# Patient Record
Sex: Male | Born: 1955 | Race: White | Hispanic: No | Marital: Married | Smoking: Never smoker
Health system: Southern US, Community
[De-identification: ages and names within clinical notes are randomized; demographics above are authoritative.]

## PROBLEM LIST (undated history)

## (undated) DIAGNOSIS — K219 Gastro-esophageal reflux disease without esophagitis: Secondary | ICD-10-CM

## (undated) DIAGNOSIS — S92919A Unspecified fracture of unspecified toe(s), initial encounter for closed fracture: Secondary | ICD-10-CM

## (undated) DIAGNOSIS — K5730 Diverticulosis of large intestine without perforation or abscess without bleeding: Secondary | ICD-10-CM

## (undated) DIAGNOSIS — K921 Melena: Secondary | ICD-10-CM

## (undated) DIAGNOSIS — I10 Essential (primary) hypertension: Secondary | ICD-10-CM

## (undated) DIAGNOSIS — Z9889 Other specified postprocedural states: Secondary | ICD-10-CM

## (undated) DIAGNOSIS — H269 Unspecified cataract: Secondary | ICD-10-CM

## (undated) DIAGNOSIS — IMO0002 Reserved for concepts with insufficient information to code with codable children: Secondary | ICD-10-CM

## (undated) DIAGNOSIS — D649 Anemia, unspecified: Secondary | ICD-10-CM

## (undated) DIAGNOSIS — K5090 Crohn's disease, unspecified, without complications: Secondary | ICD-10-CM

## (undated) DIAGNOSIS — Z8719 Personal history of other diseases of the digestive system: Secondary | ICD-10-CM

## (undated) HISTORY — DX: Unspecified cataract: H26.9

## (undated) HISTORY — PX: APPENDECTOMY: SHX54

## (undated) HISTORY — DX: Personal history of other diseases of the digestive system: Z98.890

## (undated) HISTORY — DX: Diverticulosis of large intestine without perforation or abscess without bleeding: K57.30

## (undated) HISTORY — PX: KNEE CARTILAGE SURGERY: SHX688

## (undated) HISTORY — DX: Unspecified fracture of unspecified toe(s), initial encounter for closed fracture: S92.919A

## (undated) HISTORY — DX: Anemia, unspecified: D64.9

## (undated) HISTORY — DX: Essential (primary) hypertension: I10

## (undated) HISTORY — PX: ARTHROSCOPIC REPAIR ACL: SUR80

## (undated) HISTORY — PX: CHOLECYSTECTOMY: SHX55

## (undated) HISTORY — PX: HEMICOLECTOMY: SHX854

## (undated) HISTORY — DX: Gastro-esophageal reflux disease without esophagitis: K21.9

## (undated) HISTORY — DX: Crohn's disease, unspecified, without complications: K50.90

## (undated) HISTORY — PX: OTHER SURGICAL HISTORY: SHX169

## (undated) HISTORY — PX: HERNIA REPAIR: SHX51

## (undated) HISTORY — DX: Personal history of other diseases of the digestive system: Z87.19

## (undated) HISTORY — DX: Melena: K92.1

## (undated) HISTORY — PX: LUNG SURGERY: SHX703

---

## 2006-05-12 ENCOUNTER — Ambulatory Visit: Payer: Self-pay | Admitting: Internal Medicine

## 2006-06-14 ENCOUNTER — Ambulatory Visit: Payer: Self-pay | Admitting: Internal Medicine

## 2006-08-17 ENCOUNTER — Ambulatory Visit: Payer: Self-pay | Admitting: Internal Medicine

## 2006-09-06 ENCOUNTER — Ambulatory Visit (HOSPITAL_COMMUNITY): Admission: RE | Admit: 2006-09-06 | Discharge: 2006-09-06 | Payer: Self-pay | Admitting: Internal Medicine

## 2006-09-06 HISTORY — PX: ESOPHAGOGASTRODUODENOSCOPY: SHX1529

## 2006-10-28 ENCOUNTER — Ambulatory Visit: Payer: Self-pay | Admitting: Internal Medicine

## 2006-12-28 ENCOUNTER — Ambulatory Visit: Payer: Self-pay | Admitting: Internal Medicine

## 2007-03-31 ENCOUNTER — Ambulatory Visit: Payer: Self-pay | Admitting: Internal Medicine

## 2007-04-17 ENCOUNTER — Inpatient Hospital Stay (HOSPITAL_COMMUNITY): Admission: EM | Admit: 2007-04-17 | Discharge: 2007-04-18 | Payer: Self-pay | Admitting: Emergency Medicine

## 2007-04-18 ENCOUNTER — Ambulatory Visit: Payer: Self-pay | Admitting: Gastroenterology

## 2007-04-22 ENCOUNTER — Ambulatory Visit: Payer: Self-pay | Admitting: Internal Medicine

## 2007-05-24 ENCOUNTER — Ambulatory Visit: Payer: Self-pay | Admitting: Internal Medicine

## 2007-11-10 ENCOUNTER — Ambulatory Visit: Payer: Self-pay | Admitting: Internal Medicine

## 2008-03-14 ENCOUNTER — Emergency Department (HOSPITAL_COMMUNITY): Admission: EM | Admit: 2008-03-14 | Discharge: 2008-03-14 | Payer: Self-pay | Admitting: Psychology

## 2008-08-02 ENCOUNTER — Telehealth (INDEPENDENT_AMBULATORY_CARE_PROVIDER_SITE_OTHER): Payer: Self-pay

## 2008-09-07 ENCOUNTER — Ambulatory Visit: Payer: Self-pay | Admitting: Internal Medicine

## 2008-09-07 DIAGNOSIS — K5090 Crohn's disease, unspecified, without complications: Secondary | ICD-10-CM | POA: Insufficient documentation

## 2008-09-07 DIAGNOSIS — K921 Melena: Secondary | ICD-10-CM | POA: Insufficient documentation

## 2008-09-24 ENCOUNTER — Ambulatory Visit: Payer: Self-pay | Admitting: Internal Medicine

## 2008-09-24 ENCOUNTER — Encounter: Payer: Self-pay | Admitting: Internal Medicine

## 2008-09-24 ENCOUNTER — Ambulatory Visit (HOSPITAL_COMMUNITY): Admission: RE | Admit: 2008-09-24 | Discharge: 2008-09-24 | Payer: Self-pay | Admitting: Internal Medicine

## 2008-09-24 HISTORY — PX: OTHER SURGICAL HISTORY: SHX169

## 2008-09-25 ENCOUNTER — Encounter: Payer: Self-pay | Admitting: Internal Medicine

## 2008-11-08 ENCOUNTER — Telehealth (INDEPENDENT_AMBULATORY_CARE_PROVIDER_SITE_OTHER): Payer: Self-pay

## 2008-12-30 HISTORY — PX: OTHER SURGICAL HISTORY: SHX169

## 2009-01-13 ENCOUNTER — Inpatient Hospital Stay (HOSPITAL_COMMUNITY): Admission: EM | Admit: 2009-01-13 | Discharge: 2009-01-15 | Payer: Self-pay | Admitting: Emergency Medicine

## 2009-01-14 ENCOUNTER — Ambulatory Visit: Payer: Self-pay | Admitting: Internal Medicine

## 2009-01-16 ENCOUNTER — Ambulatory Visit (HOSPITAL_COMMUNITY): Admission: RE | Admit: 2009-01-16 | Discharge: 2009-01-16 | Payer: Self-pay | Admitting: Internal Medicine

## 2009-01-16 ENCOUNTER — Ambulatory Visit: Payer: Self-pay | Admitting: Internal Medicine

## 2009-01-16 ENCOUNTER — Encounter: Payer: Self-pay | Admitting: Internal Medicine

## 2009-01-16 DIAGNOSIS — D649 Anemia, unspecified: Secondary | ICD-10-CM | POA: Insufficient documentation

## 2009-01-16 HISTORY — PX: ESOPHAGOGASTRODUODENOSCOPY: SHX1529

## 2009-01-22 ENCOUNTER — Encounter: Payer: Self-pay | Admitting: Internal Medicine

## 2009-01-25 ENCOUNTER — Encounter: Payer: Self-pay | Admitting: Internal Medicine

## 2009-02-26 ENCOUNTER — Encounter (INDEPENDENT_AMBULATORY_CARE_PROVIDER_SITE_OTHER): Payer: Self-pay

## 2009-04-04 ENCOUNTER — Encounter (INDEPENDENT_AMBULATORY_CARE_PROVIDER_SITE_OTHER): Payer: Self-pay | Admitting: *Deleted

## 2009-04-17 ENCOUNTER — Encounter: Payer: Self-pay | Admitting: Internal Medicine

## 2009-05-01 HISTORY — PX: OTHER SURGICAL HISTORY: SHX169

## 2009-05-08 ENCOUNTER — Ambulatory Visit: Payer: Self-pay | Admitting: Internal Medicine

## 2009-05-09 ENCOUNTER — Ambulatory Visit (HOSPITAL_COMMUNITY): Admission: RE | Admit: 2009-05-09 | Discharge: 2009-05-09 | Payer: Self-pay | Admitting: Internal Medicine

## 2009-05-10 ENCOUNTER — Ambulatory Visit (HOSPITAL_COMMUNITY): Admission: RE | Admit: 2009-05-10 | Discharge: 2009-05-10 | Payer: Self-pay | Admitting: Internal Medicine

## 2009-05-10 ENCOUNTER — Encounter: Payer: Self-pay | Admitting: Internal Medicine

## 2009-05-15 ENCOUNTER — Encounter: Payer: Self-pay | Admitting: Internal Medicine

## 2009-05-15 ENCOUNTER — Ambulatory Visit (HOSPITAL_COMMUNITY): Admission: RE | Admit: 2009-05-15 | Discharge: 2009-05-15 | Payer: Self-pay | Admitting: Internal Medicine

## 2009-05-17 ENCOUNTER — Ambulatory Visit: Payer: Self-pay | Admitting: Internal Medicine

## 2009-05-20 ENCOUNTER — Telehealth (INDEPENDENT_AMBULATORY_CARE_PROVIDER_SITE_OTHER): Payer: Self-pay

## 2009-05-28 ENCOUNTER — Encounter: Payer: Self-pay | Admitting: Urgent Care

## 2009-05-28 ENCOUNTER — Ambulatory Visit (HOSPITAL_COMMUNITY): Admission: RE | Admit: 2009-05-28 | Discharge: 2009-05-28 | Payer: Self-pay | Admitting: Internal Medicine

## 2009-05-28 ENCOUNTER — Telehealth (INDEPENDENT_AMBULATORY_CARE_PROVIDER_SITE_OTHER): Payer: Self-pay | Admitting: *Deleted

## 2009-05-30 ENCOUNTER — Encounter: Payer: Self-pay | Admitting: Internal Medicine

## 2009-06-13 ENCOUNTER — Encounter: Payer: Self-pay | Admitting: Internal Medicine

## 2009-09-19 ENCOUNTER — Encounter: Payer: Self-pay | Admitting: Gastroenterology

## 2009-10-23 ENCOUNTER — Encounter: Payer: Self-pay | Admitting: Internal Medicine

## 2009-10-29 ENCOUNTER — Ambulatory Visit: Payer: Self-pay | Admitting: Internal Medicine

## 2009-10-30 ENCOUNTER — Encounter: Payer: Self-pay | Admitting: Internal Medicine

## 2009-12-17 ENCOUNTER — Ambulatory Visit: Payer: Self-pay | Admitting: Cardiology

## 2009-12-17 ENCOUNTER — Observation Stay (HOSPITAL_COMMUNITY): Admission: EM | Admit: 2009-12-17 | Discharge: 2009-12-18 | Payer: Self-pay | Admitting: Emergency Medicine

## 2009-12-18 ENCOUNTER — Encounter (INDEPENDENT_AMBULATORY_CARE_PROVIDER_SITE_OTHER): Payer: Self-pay | Admitting: Internal Medicine

## 2010-01-23 ENCOUNTER — Encounter: Payer: Self-pay | Admitting: Internal Medicine

## 2010-03-06 ENCOUNTER — Telehealth (INDEPENDENT_AMBULATORY_CARE_PROVIDER_SITE_OTHER): Payer: Self-pay | Admitting: *Deleted

## 2010-04-18 ENCOUNTER — Encounter (INDEPENDENT_AMBULATORY_CARE_PROVIDER_SITE_OTHER): Payer: Self-pay

## 2010-05-12 ENCOUNTER — Encounter (INDEPENDENT_AMBULATORY_CARE_PROVIDER_SITE_OTHER): Payer: Self-pay | Admitting: *Deleted

## 2010-06-04 ENCOUNTER — Emergency Department (HOSPITAL_COMMUNITY)
Admission: EM | Admit: 2010-06-04 | Discharge: 2010-06-04 | Payer: Self-pay | Source: Home / Self Care | Admitting: Emergency Medicine

## 2010-07-01 NOTE — Medication Information (Signed)
Summary: RX Folder  RX Folder   Imported By: Hendricks Limes LPN 09/[birth year]/1914 78:29:56  _____________________________________________________________________  External Attachment:    Type:   Image     Comment:   External Document

## 2010-07-01 NOTE — Miscellaneous (Signed)
Summary: Orders Update  Clinical Lists Changes  Orders: Added new Test order of T-Basic Metabolic Panel ([id number]) - Signed Added new Test order of T-CBC w/Diff ([id number]) - Signed 

## 2010-07-01 NOTE — Letter (Signed)
Summary: Recall, Labs Needed  [facility]  [address]   [place], [place] [address]   Phone: [phone]  Fax: [phone]    April 18, 2010  ANDREJ SPAGNOLI [address] [place], [place]  [address] 1955/11/08   Dear Mr. Bollier,   Our records indicate it is time to repeat your blood work.  You can take the enclosed form to the lab on or near the date indicated.  Please make note of the new location of the lab:   [address], 2nd floor   [facility]  Our office will call you within a week to ten business days with the results.  If you do not hear from [place] in 10 business days, you should call the office.  If you have any questions regarding this, call the office at [phone], and ask for the nurse.  Labs are due on 05/01/2010.   Sincerely,    Hendricks Limes LPN  [facility] Ph: [phone]   Fax: [phone]

## 2010-07-01 NOTE — Letter (Signed)
Summary: GI CLINIC APPT CONFIRMATION  GI CLINIC APPT CONFIRMATION   Imported By: Diana Eves 06/13/2009 09:39:19  _____________________________________________________________________  External Attachment:    Type:   Image     Comment:   External Document

## 2010-07-01 NOTE — Letter (Signed)
Summary: External Other  External Other   Imported By: Peggyann Shoals 10/23/2009 09:05:24  _____________________________________________________________________  External Attachment:    Type:   Image     Comment:   External Document

## 2010-07-01 NOTE — Progress Notes (Signed)
Summary: Change Rx to Generic ASAP  Phone Note Call from Patient   Reason for Call: Refill Medication, Talk to Nurse Summary of Call: Pt's wife [facility]) called and wants [place] to change Pt's Rx of Entocort EC 3 mg XR24h-cap to GENERIC (Budesonide) because it'll be cheaper on her insurance and he will also need a 90 day supply. She said to fax new RX to [phone]. Initial call taken by: Diana Eves,  March 06, 2010 3:34 PM     Appended Document: Change Rx to Generic ASAP    Prescriptions: BUDESONIDE 3 MG XR24H-CAP (BUDESONIDE) Two (6mg ) by mouth daily  #180 x 1   Entered and Authorized by:   Leanna Battles. Dixon Boos   Signed by:   Leanna Battles Lewis PA-C on 03/07/2010   Method used:   Printed then faxed to ...         RxID:   [id number]     Appended Document: Change Rx to Generic ASAP rx faxed to number above

## 2010-07-01 NOTE — Letter (Signed)
Summary: External Other  External Other   Imported By: Peggyann Shoals 10/30/2009 11:45:19  _____________________________________________________________________  External Attachment:    Type:   Image     Comment:   External Document
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
## 2010-07-01 NOTE — Letter (Signed)
Summary: [facility]S [facility]  [facility]S [facility]   Imported By: Rexene Alberts 01/23/2010 09:05:56  _____________________________________________________________________  External Attachment:    Type:   Image     Comment:   External Document

## 2010-07-03 NOTE — Letter (Signed)
Summary: Recall Office Visit  [facility]  [address]   [place], [place] [address]   Phone: [phone]  Fax: [phone]      May 12, 2010   [name] [address] [place], [place]  [address] 31-Jul-1955   Dear [name],   According to our records, it is time for you to schedule a follow-up office visit with [place].   At your convenience, please call [phone] to schedule an office visit. If you have any questions, concerns, or feel that this letter is in error, we would appreciate your call.   Sincerely,    Diana Eves  [facility] Ph: [phone]   Fax: [phone]

## 2010-08-12 ENCOUNTER — Encounter: Payer: Self-pay | Admitting: Urgent Care

## 2010-08-16 LAB — CBC: Hemoglobin: 12.8 g/dL — ABNORMAL LOW (ref 13.0–17.0)

## 2010-08-16 LAB — DIFFERENTIAL
Basophils Absolute: 0 10*3/uL (ref 0.0–0.1)
Basophils Relative: 0 % (ref 0–1)
Eosinophils Absolute: 0.4 10*3/uL (ref 0.0–0.7)
Eosinophils Relative: 3 % (ref 0–5)
Lymphs Abs: 2.1 10*3/uL (ref 0.7–4.0)
Neutro Abs: 8.1 10*3/uL — ABNORMAL HIGH (ref 1.7–7.7)

## 2010-08-16 LAB — COMPREHENSIVE METABOLIC PANEL
Alkaline Phosphatase: 50 U/L (ref 39–117)
BUN: 6 mg/dL (ref 6–23)
CO2: 24 mEq/L (ref 19–32)
Calcium: 8.9 mg/dL (ref 8.4–10.5)
Chloride: 95 mEq/L — ABNORMAL LOW (ref 96–112)
Creatinine, Ser: 0.63 mg/dL (ref 0.4–1.5)
GFR calc Af Amer: >60 mL/min (ref >60)
GFR calc non Af Amer: >60 mL/min (ref >60)
Potassium: 3.4 mEq/L — ABNORMAL LOW (ref 3.5–5.1)
Sodium: 128 mEq/L — ABNORMAL LOW (ref 135–145)
Total Protein: 6.3 g/dL (ref 6.0–8.3)

## 2010-08-16 LAB — CARDIAC PANEL(CRET KIN+CKTOT+MB+TROPI)
Relative Index: INVALID (ref 0.0–2.5)
Total CK: 76 U/L (ref 7–232)
Troponin I: 0.03 ng/mL (ref 0.00–0.06)

## 2010-08-16 LAB — POCT CARDIAC MARKERS
Myoglobin, poc: 31 ng/mL (ref 12–200)
Troponin i, poc: <0.05 ng/mL (ref 0.00–0.09)

## 2010-08-16 LAB — GLUCOSE, CAPILLARY: Glucose-Capillary: 123 mg/dL — ABNORMAL HIGH (ref 70–99)

## 2010-08-16 LAB — TSH: TSH: 2.129 u[IU]/mL (ref 0.350–4.500)

## 2010-08-19 NOTE — Letter (Signed)
Summary: [facility](CHOLESTYRAMINE PACKET)  [facility] [facility](CHOLESTYRAMINE PACKET)   Imported By: Rexene Alberts 08/12/2010 11:06:36  _____________________________________________________________________  External Attachment:    Type:   Image     Comment:   External Document

## 2010-08-22 ENCOUNTER — Other Ambulatory Visit: Payer: Self-pay | Admitting: Urgent Care

## 2010-08-22 ENCOUNTER — Ambulatory Visit: Payer: Self-pay | Admitting: Internal Medicine

## 2010-08-22 MED ORDER — ESOMEPRAZOLE MAGNESIUM 40 MG PO CPDR: 40.0000 mg | DELAYED_RELEASE_CAPSULE | Freq: Two times a day (BID) | ORAL | Status: DC

## 2010-08-22 MED ORDER — BUDESONIDE 3 MG PO CP24: 6.0000 mg | ORAL_CAPSULE | ORAL | Status: DC

## 2010-08-22 MED ORDER — CYANOCOBALAMIN 1000 MCG/ML IJ SOLN: 1000.0000 ug | INTRAMUSCULAR | Status: DC

## 2010-08-25 ENCOUNTER — Ambulatory Visit: Payer: Self-pay | Admitting: Internal Medicine

## 2010-09-06 LAB — BASIC METABOLIC PANEL
BUN: 9 mg/dL (ref 6–23)
CO2: 24 mEq/L (ref 19–32)
CO2: 25 mEq/L (ref 19–32)
CO2: 27 mEq/L (ref 19–32)
Calcium: 9 mg/dL (ref 8.4–10.5)
Calcium: 9.6 mg/dL (ref 8.4–10.5)
Chloride: 93 mEq/L — ABNORMAL LOW (ref 96–112)
Chloride: 95 mEq/L — ABNORMAL LOW (ref 96–112)
Chloride: 85 mEq/L — ABNORMAL LOW (ref 96–112)
Creatinine, Ser: 0.66 mg/dL (ref 0.4–1.5)
GFR calc Af Amer: >60 mL/min (ref >60)
GFR calc Af Amer: >60 mL/min (ref >60)
GFR calc Af Amer: >60 mL/min (ref >60)
GFR calc Af Amer: >60 mL/min (ref >60)
GFR calc non Af Amer: >60 mL/min (ref >60)
Glucose, Bld: 93 mg/dL (ref 70–99)
Glucose, Bld: 120 mg/dL — ABNORMAL HIGH (ref 70–99)
Potassium: 4.3 mEq/L (ref 3.5–5.1)
Potassium: 4.4 mEq/L (ref 3.5–5.1)
Potassium: 4.4 mEq/L (ref 3.5–5.1)
Potassium: 4 mEq/L (ref 3.5–5.1)
Potassium: 4.1 mEq/L (ref 3.5–5.1)
Sodium: 121 mEq/L — ABNORMAL LOW (ref 135–145)
Sodium: 127 mEq/L — ABNORMAL LOW (ref 135–145)
Sodium: 118 mEq/L — CL (ref 135–145)

## 2010-09-06 LAB — CBC
HCT: 25.3 % — ABNORMAL LOW (ref 39.0–52.0)
HCT: 27.5 % — ABNORMAL LOW (ref 39.0–52.0)
HCT: 27.5 % — ABNORMAL LOW (ref 39.0–52.0)
Hemoglobin: 9.1 g/dL — ABNORMAL LOW (ref 13.0–17.0)
MCHC: 33.2 g/dL (ref 30.0–36.0)
MCV: 69.8 fL — ABNORMAL LOW (ref 78.0–100.0)
MCV: 70.2 fL — ABNORMAL LOW (ref 78.0–100.0)
MCV: 69.6 fL — ABNORMAL LOW (ref 78.0–100.0)
MCV: 70 fL — ABNORMAL LOW (ref 78.0–100.0)
Platelets: 377 10*3/uL (ref 150–400)
RBC: 3.63 MIL/uL — ABNORMAL LOW (ref 4.22–5.81)
RBC: 3.91 MIL/uL — ABNORMAL LOW (ref 4.22–5.81)
RBC: 3.71 MIL/uL — ABNORMAL LOW (ref 4.22–5.81)
RDW: 14.6 % (ref 11.5–15.5)
WBC: 5.9 10*3/uL (ref 4.0–10.5)
WBC: 9.2 10*3/uL (ref 4.0–10.5)

## 2010-09-06 LAB — DIFFERENTIAL
Basophils Absolute: 0.1 10*3/uL (ref 0.0–0.1)
Basophils Relative: 1 % (ref 0–1)
Basophils Relative: 1 % (ref 0–1)
Eosinophils Absolute: 0.3 10*3/uL (ref 0.0–0.7)
Eosinophils Absolute: 0.5 10*3/uL (ref 0.0–0.7)
Eosinophils Relative: 5 % (ref 0–5)
Eosinophils Relative: 6 % — ABNORMAL HIGH (ref 0–5)
Lymphocytes Relative: 19 % (ref 12–46)
Lymphs Abs: 1.8 10*3/uL (ref 0.7–4.0)
Lymphs Abs: 1.7 10*3/uL (ref 0.7–4.0)
Monocytes Absolute: 1.2 10*3/uL — ABNORMAL HIGH (ref 0.1–1.0)
Monocytes Absolute: 1.2 10*3/uL — ABNORMAL HIGH (ref 0.1–1.0)
Monocytes Relative: 21 % — ABNORMAL HIGH (ref 3–12)
Monocytes Relative: 22 % — ABNORMAL HIGH (ref 3–12)
Monocytes Relative: 14 % — ABNORMAL HIGH (ref 3–12)
Monocytes Relative: 17 % — ABNORMAL HIGH (ref 3–12)
Neutro Abs: 2.5 10*3/uL (ref 1.7–7.7)
Neutro Abs: 5.1 10*3/uL (ref 1.7–7.7)
Neutrophils Relative %: 61 % (ref 43–77)

## 2010-09-06 LAB — COMPREHENSIVE METABOLIC PANEL
Albumin: 4.1 g/dL (ref 3.5–5.2)
BUN: 6 mg/dL (ref 6–23)
Creatinine, Ser: 0.59 mg/dL (ref 0.4–1.5)
Total Bilirubin: 0.5 mg/dL (ref 0.3–1.2)
Total Protein: 6.8 g/dL (ref 6.0–8.3)

## 2010-09-06 LAB — NA AND K (SODIUM & POTASSIUM), RAND UR: Sodium, Ur: 41 mEq/L

## 2010-09-06 LAB — GLUCOSE, CAPILLARY
Glucose-Capillary: 104 mg/dL — ABNORMAL HIGH (ref 70–99)
Glucose-Capillary: 136 mg/dL — ABNORMAL HIGH (ref 70–99)
Glucose-Capillary: 120 mg/dL — ABNORMAL HIGH (ref 70–99)
Glucose-Capillary: 130 mg/dL — ABNORMAL HIGH (ref 70–99)

## 2010-09-06 LAB — TYPE AND SCREEN

## 2010-09-06 LAB — OSMOLALITY, URINE: Osmolality, Ur: 198 mOsm/kg — ABNORMAL LOW (ref 390–1090)

## 2010-09-06 LAB — IRON AND TIBC
Iron: 23 ug/dL — ABNORMAL LOW (ref 42–135)
Saturation Ratios: 5 % — ABNORMAL LOW (ref 20–55)
TIBC: 502 ug/dL — ABNORMAL HIGH (ref 215–435)
UIBC: 479 ug/dL

## 2010-09-06 LAB — RETICULOCYTES: RBC.: 3.72 MIL/uL — ABNORMAL LOW (ref 4.22–5.81)

## 2010-09-06 LAB — FERRITIN: Ferritin: 4 ng/mL — ABNORMAL LOW (ref 22–322)

## 2010-09-11 ENCOUNTER — Encounter: Payer: Self-pay | Admitting: Internal Medicine

## 2010-10-03 ENCOUNTER — Ambulatory Visit (INDEPENDENT_AMBULATORY_CARE_PROVIDER_SITE_OTHER): Payer: Medicare Other | Admitting: Internal Medicine

## 2010-10-03 ENCOUNTER — Encounter: Payer: Self-pay | Admitting: Internal Medicine

## 2010-10-03 DIAGNOSIS — R195 Other fecal abnormalities: Secondary | ICD-10-CM | POA: Insufficient documentation

## 2010-10-03 DIAGNOSIS — K5090 Crohn's disease, unspecified, without complications: Secondary | ICD-10-CM

## 2010-10-03 DIAGNOSIS — K219 Gastro-esophageal reflux disease without esophagitis: Secondary | ICD-10-CM | POA: Insufficient documentation

## 2010-10-03 NOTE — Assessment & Plan Note (Signed)
Ileal Crohn's disease. Clinically he is more or less in remission on Entocort 6 mg daily. Will stay away from other immunosuppressive agents as discussed with the patient previously. I feel the benefits of long-term Entocort outweigh the risks at this time. We'll reassess mucosal disease at the time of colonoscopy in the near future.
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
## 2010-10-08 ENCOUNTER — Encounter: Payer: Self-pay | Admitting: Internal Medicine

## 2010-10-13 ENCOUNTER — Other Ambulatory Visit: Payer: Self-pay | Admitting: Internal Medicine

## 2010-10-13 ENCOUNTER — Ambulatory Visit (HOSPITAL_COMMUNITY)
Admission: RE | Admit: 2010-10-13 | Discharge: 2010-10-13 | Disposition: A | Discharge status: Home / Self Care | Payer: BC Managed Care – PPO | Source: Ambulatory Visit | Attending: Internal Medicine | Admitting: Internal Medicine

## 2010-10-13 ENCOUNTER — Encounter: Payer: Medicare Other | Admitting: Internal Medicine

## 2010-10-13 DIAGNOSIS — Z79899 Other long term (current) drug therapy: Secondary | ICD-10-CM | POA: Insufficient documentation

## 2010-10-13 DIAGNOSIS — R195 Other fecal abnormalities: Secondary | ICD-10-CM

## 2010-10-13 DIAGNOSIS — K5730 Diverticulosis of large intestine without perforation or abscess without bleeding: Secondary | ICD-10-CM

## 2010-10-13 DIAGNOSIS — K5289 Other specified noninfective gastroenteritis and colitis: Secondary | ICD-10-CM

## 2010-10-13 DIAGNOSIS — I10 Essential (primary) hypertension: Secondary | ICD-10-CM | POA: Insufficient documentation

## 2010-10-13 DIAGNOSIS — Z9049 Acquired absence of other specified parts of digestive tract: Secondary | ICD-10-CM | POA: Insufficient documentation

## 2010-10-13 DIAGNOSIS — K921 Melena: Secondary | ICD-10-CM | POA: Insufficient documentation

## 2010-10-13 DIAGNOSIS — E119 Type 2 diabetes mellitus without complications: Secondary | ICD-10-CM | POA: Insufficient documentation

## 2010-10-13 HISTORY — PX: COLONOSCOPY: SHX174

## 2010-10-13 HISTORY — DX: Diverticulosis of large intestine without perforation or abscess without bleeding: K57.30

## 2010-10-13 LAB — GLUCOSE, CAPILLARY: Glucose-Capillary: 135 mg/dL — ABNORMAL HIGH (ref 70–99)

## 2010-10-14 NOTE — Consult Note (Signed)
NAMEMARQUIST, BINSTOCK                 ACCOUNT NO.:  [id number]   MEDICAL RECORD NO.:  [id number]          PATIENT TYPE:  INP   LOCATION:  A228                          FACILITY:  [facility]   PHYSICIAN:  Kassie Mends, M.D.      DATE OF BIRTH:  13-Mar-1956   DATE OF CONSULTATION:  DATE OF DISCHARGE:                                 CONSULTATION   REASON FOR CONSULTATION:  Epigastric pain, nausea and vomiting.   HISTORY OF PRESENT ILLNESS:  [name] is a 55 year old Caucasian male.  The patient is followed by Dr. Jena Gauss with a history of ileocolonic  Crohn's disease. He also has a history of chronic GERD. He tells me he  has had refractory GERD symptoms and epigastric burning every since his  neck vein was decreased to once daily in September. He did once daily  dosing for about a month and was having to use Pepto and Maalox all the  time. One month later he resumed b.i.d. Nexium at Dr. Luvenia Starch advice. He  tells me the epigastric burning and pain was much improved on b.i.d.  dosing although he still has some episodes of pain. For the last 2  weeks, he has had chronic nausea. He tells me it is always  postprandially usually within 5 minutes of eating. He begins to have  ptyalism. He also notes early satiety. He tells me he is only able to  eat a few bites. He denies any lower abdominal pain. He did have eight  loose stools 2 days ago that were watery but denies any rectal bleeding  or melena. His baseline is 3-4 loose stools per day. He did have one  bowel movement this morning which is nonbloody without any mucous or  melena. His wife states that he was clammy when he came in last night  and has had chills but denied any fever. He denies any old contacts,  denies any foreign travel, denies any new medications. He was changed  from prednisone to Entocort back in the fall. Since he made the change,  he has noticed decreased appetite.   PAST MEDICAL AND SURGICAL HISTORY:  He is diagnosed with  ileocolonic  Crohn's disease some 30 years ago. He has had part of his right colon  removed and a second surgery after he developed a staph infection about  6 weeks after the first week, he had a second resection. He had left  renal cell carcinoma status post nephrectomy in 2001. He has chronic  GERD. The last EGD was by Dr. Jena Gauss on September 06, 2006. He was found to  have a small hiatal hernia. There was no evidence of pill injury but it  was suspected he may have had transient Candida. Diabetes mellitus. The  last colonoscopy was in 2007 by Dr. Despina Hidden in [place]. I am unsure of  the findings. He received monthly B12 injections for pernicious anemia.   MEDICATIONS PRIOR TO ADMISSION:  1. Metformin 1 gram b.i.d.  2. Trazodone 100 mg q.h.s.  3. Aspirin 81 mg 3 times a week.  4. Entocort 3 mg  daily.  5. B12 injections monthly.  6. Nexium 40 mg b.i.d.   ALLERGIES:  PENTASA intolerant and CODEINE.   FAMILY HISTORY:  Father deceased age 88 with pancreatic cancer. Mother  alive with history of hypertension. No known history of colorectal  carcinoma, liver or chronic GI problems.   SOCIAL HISTORY:  Mr. Moline is married. He is stable and he was  previously a Music therapist. He denies any tobacco, alcohol or drug use.   REVIEW OF SYSTEMS:  See HPI otherwise negative. NEUROLOGIC:  He denies  any headache, dizziness or weakness. CARDIOVASCULAR:  Denies any chest  pain or palpitations.   PHYSICAL EXAMINATION:  VITAL SIGNS:  Temperature 98.3, pulse 83,  respirations 20, blood pressure 126/84, O2 sat 98% on room air.  GENERAL:  Mr. Slagter is a well-developed, well-nourished, Caucasian male  in no acute distress.  HEENT:  Oropharynx clear.  Nonicteric. Conjunctivae pink.  Oropharynx  pink and moist without lesions.  NECK:  Supple without any lymphadenopathy.  CHEST:  Regular rate and rhythm. Normal S1, S2. Without murmurs,  thrills, rubs or gallops.  LUNGS:  Clear to auscultation bilaterally.   ABDOMEN:  Positive bowel sounds x4. No bruits auscultated. He does have  mild epigastric tenderness on deep palpation. There is no rebound  tenderness or guarding. No hepatosplenomegaly or mass.  EXTREMITIES:  Without clubbing or edema bilaterally.  SKIN:  Pink, warm and dry without any rash or jaundice.   LABORATORY DATA:  WBC is 10.8, hemoglobin 13, hematocrit 37.5, platelets  266, calcium 9.1, sodium 132, potassium 4.6 and chloride 98, CO2 27, BUN  7, creatinine 0.82 and glucose 117, total bilirubin 0.6, alkaline  phosphatase 49. AST 18, ALT 20, total protein 6.1, albumin 3.8.   IMPRESSION:  Mr. Carbonneau is a 55 year old, Caucasian male with a history  of GERD, ileocolonic Crohn's disease, diabetes mellitus, and status post  left nephrectomy for renal cell carcinoma with a two week history of  nausea, vomiting and epigastric pain. The symptoms are worse post  prandially, differentials include diabetic gastroparesis, small-bowel  obstruction/ileus, Crohn's flare, and left likely gallbladder disease.  EGD April 2008 by Dr. Jena Gauss for dyspepsia and refractory GERD was normal  except for hiatal hernia. Of note, he did have hyponatremia on  admission. His sodium was 123, this is now up to 132.   PLAN:  1. CT of the abdomen and pelvis today.  2. Ritalin 5 mg p.o. a.c. and h.s.  3. Carafate 1 gram q.i.d.  4. Change Protonix to 40 mg b.i.d.   We would like to thank the [facility] team for allowing [place] to  participate in the care of Mr. Patnode.      Lorenza Burton, N.P.      Kassie Mends, M.D.  Electronically Signed   KJ/MEDQ  D:  04/18/2007  T:  04/18/2007  Job:  [id number]

## 2010-10-14 NOTE — Assessment & Plan Note (Signed)
NAMEREIN, POPOV                  CHART#:  [id number]   DATE:                                   DOB:  1955/06/11   PRIMARY CARE PHYSICIAN:  Suzy Bouchard.   CHIEF COMPLAINT:  Followup ileocolonic Crohn's/GERD.   PROBLEMS LIST:  1. History of ileocolonic Crohn's disease with a 30+ year history.  He      had previously been steroid dependent.  2. Right hemicolectomy.  3. History of small bowel obstruction secondary to adhesions requiring      surgical intervention.  4. Left renal cell carcinoma, status post wedge resection of left      kidney.  5. History of histoplasmosis.  6. Chronic gastroesophageal reflux disease.  7. Diabetes mellitus.  8. Osteoporosis.  9. Bilateral cataracts.  10.Bilateral inguinal herniorrhaphies.  11.Status post appendectomy.  12.Status post anterior cruciate ligament repair of the right knee and      left leg and right arm surgery.  13.Asymptomatic cholelithiasis.   SUBJECTIVE:  Mr. Koeppen is a 55 year old Caucasian male who has been  maintained on Entocort 3 mg daily for his ileocolonic Crohn's disease.  He does take an occasional p.r.n. cholestyramine.  He occasionally has  diarrhea usually anywhere between 2 and 3 loose stools per day.  He  denies any rectal bleeding or melena.  He has lost 5 pounds in the last  6 months; however, his appetite has been good.  He tells me he is unable  to eat as much as he has been caring for his sick mother who is dying of  breast cancer.  He denies any abdominal pain.  He denies any fever,  chills, anorexia, or early satiety.  He has previous history of being on  prednisone about 20 years.  He did have a DEXA scan last fall which  should be repeated again this fall.  His blood sugars have been doing  well.  He had an abnormal CT on April 18, 2007, which showed a soft  tissue like structure adjacent to the anterior superior aspect of the  left kidney, pancreatic tail, etiology uncertain.  He did take the CD to  be reviewed by Dr. Claude Manges down at [facility].  They felt that this was the pancreatic tail per his  report.   CURRENT MEDICATIONS:  See the list from November 10, 2007.   ALLERGIES:  Oxycodone and Pentasa.   OBJECTIVE:  VITAL SIGNS:  Weight is 180 pounds, height 68 inches,  temperature 97.8, blood pressure 122/80, and pulse 88.  GENERAL: Mr. Mcburney is a well developed and well nourished Caucasian male  in no acute distress.  HEENT: Sclerae are clear, noninjected.  Conjunctivae is pink.  Oropharynx is pink and moist without any lesions.  NECK:  Supple without mass or thyromegaly.  HEART:  Regular rate and rhythm.  Normal S1 and S2.  No murmurs, clicks,  rubs, or gallops.  LUNGS:  Clear to auscultation bilaterally.  ABDOMEN:  Positive bowel sounds x4.  No bruits auscultated.  Soft,  nontender, and nondistended without palpable mass or hepatosplenomegaly.  No rebound tenderness or guarding.  EXTREMITIES:  Without clubbing or edema.   ASSESSMENT:  Mr. Fujiwara is a 55 year old Caucasian male with a 30+ year  history  of ileocolonic Crohn's disease, remission, on low-dose Entocort,  intolerant of Pentasa previously, immunomodulator therapy cannot be  considered, given his history of renal cell carcinoma.  Chronic  gastroesophageal reflux disease,well controlled on b.i.d. proton pump  inhibitor.   RECOMMENDATIONS:  1. He should continue Entocort 3 mg daily.  2. Repeat bone density test after next office visit in 6 months with      Dr. Jena Gauss.  3. For his insomnia, he can try Tylenol PM.  If this does not work, he      is to follow with Dr. Dorna Leitz, his primary care [facility] Baria.  4. Nexium 40 mg b.i.d. #62 with 5 refills.  5. Questran 4 g p.o. daily not within 2 hours of his other      medications, as needed for diarrhea, quantity sufficient for a      month with 5 refills.  6. Entocort 3 mg daily #31 with 5 refills.  7. CBC, CMP, and sed rate  today.       Lorenza Burton, N.P.  Electronically Signed     R. Roetta Sessions, M.D.  Electronically Signed    KJ/MEDQ  D:  11/10/2007  T:  11/11/2007  Job:  [id number]   cc:   Suzy Bouchard

## 2010-10-14 NOTE — Discharge Summary (Signed)
[name], Stephen                 ACCOUNT NO.:  [id number]   MEDICAL RECORD NO.:  [id number]          PATIENT TYPE:  INP   LOCATION:  A228                          FACILITY:  [facility]   PHYSICIAN:  Dorris Singh, DO    DATE OF BIRTH:  [birth year]   DATE OF ADMISSION:  04/17/2007  DATE OF DISCHARGE:  11/17/2008LH                               DISCHARGE SUMMARY   ADMISSION DIAGNOSES:  1. Hyponatremia.  2. Dyspepsia.   DISCHARGE DIAGNOSES:  1. Crohn's disease.  2. Dyspepsia.   CONSULTATIONS:  Dr. Jena Gauss and  Dr. Cira Servant.   TESTING THAT WAS DONE:  He had a CT of the head today looking for any  cranial abnormalities.  Mild atrophy but no acute intracranial findings.  A 2-D chest x-ray which was negative, but he had post surgical changes  to his lungs.  No evidence of acute cardiopulmonary disease.  Also, the  patient had a CT of the abdomen.  Per verbal reading from GI, the  patient did have some colonic thickening.   His H&P was done by Dr. Dorris Singh.  Please refer to that, but to  summarize, this is a 55 year old Caucasian male who presented to the  emergency room complaining of epigastric pain.  Upon arrival, it was  found that he was severely hyponatremic.  He reports a history of  Crohn's disease, and has an extensive medical history regarding colon  cancer and Crohn's disease, and has been Dr. Jena Gauss for many years  regarding this.  The patient was then hydrated in the ED and could not  correct it.  It was at this point in time, due to the risk, to go ahead  and admit the patient and see if this could be corrected.  The patient  was then admitted to 2A on telemetry, and went ahead and fluid  restricted him and ordered a head CT and also had Dr. Jena Gauss and  participate in GI and DVT prophylaxis.  The patient was seen by Dr.  Jena Gauss on April 18, 2007.  A CT of the abdomen was ordered, which was  read by Dr. Cira Servant.  After their consult, it was determined that the  patient  could follow up with them in the office.  Also, the patient's  hyponatremia was corrected.  He was sent home on several medications,  and also they gathered a stool sample from him prior to discharge, which  they will follow up on when the patient followed up with him.   MEDICATIONS THAT HE WILL GO ON:  1. Metformin 100 mg two times a day.  2. Trazodone 100 mg q.h.s.  3. Aspirin 81 mg three times a week.  4. Entocort daily (I do not know the dose here).  5. B-12 injections monthly.  6. Nexium two a day.  He was also sent home on:  1. Carafate flurry 1 gm twice a day.  2. Cholestyramine once a day.  3. Phenergan 25 mg every 4-6 hours as needed for vomiting.   FOLLOWUP:  The patient was given explicit follow up instructions to  follow up with Dr. Luvenia Starch office, and they will contact him for an  appointment.   CONDITION ON DISCHARGE:  He was discharged in stable condition.      Dorris Singh, DO  Electronically Signed     CB/MEDQ  D:  04/18/2007  T:  04/18/2007  Job:  [id number]

## 2010-10-14 NOTE — Assessment & Plan Note (Signed)
NAMEJO, CERONE                  CHART#:  [id number]   DATE:  12/28/2006                       DOB:  05/23/1956   Followup of Crohn's disease, gastroesophageal reflux disease, last seen  10/28/2006.   He has done well from a standpoint of his Crohn's, 1-3 formed bowel  movements daily.  Takes Questran only intermittently.  He is down to 6  mg on the Entocort daily.  Since he has been off prednisone, he has been  able to lose quite a bit more weight.  He is down to 193 now.  He was  207.5 on 10/28/2006.  He feels well.  He has not had any abdominal pain,  no bleeding.  Reflux symptoms continue to be an issue.  He is requires  Nexium 40 mg orally b.i.d., he has not been able to drop back to once  daily.  Prior EGD demonstrated only a small hiatal hernia.  Last  colonoscopy was in February of 2007 by Dr. Elder Cyphers up in [place].  He  did call in wanting to go back on prednisone because of abdominal pain,  11/12/2006.  Apparently, that was incomplete.  He was having some left  hip thigh pains for which he went to see his orthopedist who gave him a  steroid shot in his left hip which helped that problem.  He has been  intolerant to Pentasa previously, and he feels immunomodulators had  something to do with his renal cell carcinoma previously.  That line of  therapy is out of the question.   CURRENT MEDICATIONS:  See updated list.  He is requiring Nexium 40 mg  orally b.i.d., Entocort 6 mg daily.   ALLERGIES:  OXYCODONE.   Intolerance to PENTASA.   PHYSICAL EXAMINATION:  He looks well.  Weight 193, height 5 feet 8 inches, temperature 98.1, BP 108/2, pulse  64.  SKIN:  Warm and dry.  ABDOMEN:  Flat, positive bowel sounds.  Soft, entirely nontender, no  appreciable mass or organomegaly.   ASSESSMENT:  1. Small bowel Crohn's disease, now in remission on low-dose Entocort.      I told the patient he will have to be on something as feasible to      maintain a remission.  At this  point in time, I am asking to him      drop down to one 3 mg Entocort tablet daily for the next 3 months.      He is going to call if he has any problems in the interim.  2. Gastroesophageal reflux disease requiring b.i.d. proton pump      inhibitor.  Hopefully, as he has lost weight he will be able to      drop back to 1, but twice daily is okay for the time being.  3. His blood pressure is running a little low.  This is most likely      related to Lisinopril as he drops on down.  That dose may need to      be adjusted.  This is for cardio protection as he describes, he      really has no history of hypertension.   Unless something comes up, plan to see this nice gentleman back in 3  months.   ADDENDUM:  He is to see Dr. Richardson Landry in  September, I have urged him to keep  that appointment.       Jonathon Bellows, M.D.  Electronically Signed     RMR/MEDQ  D:  12/28/2006  T:  12/28/2006  Job:  [id number]   cc:   Suzy Bouchard  MD Sollenberger, MD

## 2010-10-14 NOTE — H&P (Signed)
[name], [name]                 ACCOUNT NO.:  [id number]   MEDICAL RECORD NO.:  [id number]          PATIENT TYPE:  INP   LOCATION:  A228                          FACILITY:  [facility]   PHYSICIAN:  Dorris Singh, DO    DATE OF BIRTH:  07/13/1955   DATE OF ADMISSION:  04/17/2007  DATE OF DISCHARGE:  LH                              HISTORY & PHYSICAL   The patient is a 55 year old Caucasian male who presented to the  emergency room complaining of epigastric pain that started a couple of  weeks ago.  He stated that he has been seen by Dr. Jena Gauss and has had his  medications changed for his reflux and has noticed that he has had a  burning in his stomach since that time.  Also, he has noticed that he  has had some diarrhea as well, and this was just recently.  He has a  history of Crohn's disease, diabetes, acid reflux, GI bleeding, and  renal cell carcinoma.  He has had a colon resection, colostomy, and  hernia repair.  He has been a nonsmoker, nondrinker, no drug use.  He  has allergy to CODEINE and OXYCODONE.   CURRENT MEDICATIONS:  1. Darvon compound oral.  2. Trazodone 100 mg.  3. Gemfibrozil 300 mg twice a day.  4. Metformin 1000 mg twice a day.  5. Aspirin 81 mg a day.  6. Enterocort EC 3 mg, 24.  7. B12 injections once a month.  8. Cholestyramine powder one scoop.  There is no frequency given.   REVIEW OF SYSTEMS:  CONSTITUTIONAL:  No recent fatigue or problems with  hot and cold.  EYES, EARS, NOSE, THROAT:  Noncontributory.  CARDIOVASCULAR:  Noncontributory.  RESPIRATORY:  Noncontributory.  GASTROINTESTINAL:  Positive for diarrhea and abdominal pain, more so mid  epigastric in location.  MUSCULOSKELETAL:  Noncontributory.  SKIN:  Noncontributory.  NEUROLOGIC:  Noncontributory.   PHYSICAL EXAMINATION:  VITAL SIGNS:  Temperature 98.6, pulse 63,  respirations 20, blood pressure 118/76.  GENERAL:  This is a well-developed, well-nourished Caucasian male who is  in no acute  distress.  HEENT:  Head is normocephalic and atraumatic.  Eyes are equal and  reactive to light bilaterally.  TMs visualized bilaterally.  Mouth: No  erythema or exudate noted.  NECK:  Supple.  No lymphadenopathy noted.  CARDIOVASCULAR:  Regular rate and rhythm.  No murmurs, rubs, or gallops.  LUNGS:  Clear to auscultation bilaterally.  No wheezes, rales, or  rhonchi.  ABDOMEN:  Soft, nontender, nondistended.  Bowel sounds in all four  quadrants.  No organomegaly noted.  EXTREMITIES:  Positive pulses.  No ecchymosis, cyanosis, or edema noted.  NEUROLOGIC:  Cranial nerves II-XII grossly intact.   EKG was done which was normal.   On his BMET,  it was noted that his sodium was 123.  He was given 2  liters of fluid which did not correct.  It was rechecked and was still  123.   Will admit the patient, one,  for hyponatremia and add dyspepsia to the  diagnosis as well.   PLAN:  1. Admit to 2A on telemetry.  2. Monitor patient.  3. After no correction with 2 liters, will go ahead and fluid restrict      the patient and obtain CMP and CBC, urine osmolarity, specific      gravity, urine sodium, and will give him a GI cocktail to see if      this helps with his dyspepsia.  4. Will give Protonix and do Lovenox 40 mg subcutaneously daily.      Aspirin 325 mg p.o. nightly.  5. Will continue to monitor the patient and change medical management      as necessary.  6. Anticipate possible discharge in 1-2 days.      Dorris Singh, DO  Electronically Signed     CB/MEDQ  D:  04/17/2007  T:  04/17/2007  Job:  [id number]

## 2010-10-14 NOTE — H&P (Signed)
NAMEBODIN, GORKA                 ACCOUNT NO.:  [id number]   MEDICAL RECORD NO.:  [id number]          PATIENT TYPE:  INP   LOCATION:  IC09                          FACILITY:  [facility]   PHYSICIAN:  Margaretmary Dys, M.D.DATE OF BIRTH:  19-Sep-1955   DATE OF ADMISSION:  01/12/2009  DATE OF DISCHARGE:  LH                              HISTORY & PHYSICAL   ADMISSION DIAGNOSES:  1. Generalized weakness.  2. Severe hyponatremia likely secondary to water intoxication.  3. History of Crohn disease with no evidence of acute exacerbation at      this time.  4. Anemia, likely iron deficiency and chronic.  5. Type 2 diabetes well controlled.   CHIEF COMPLAINT:  Generalized weakness.Marland Kitchen   HISTORY OF PRESENT ILLNESS:  Mr. [name] is a 55 year old male who  presented to the emergency room complaining of generalized weakness.  The patient reports that he just feels sort of lethargic and does not  have his usual strength.  The patient mentioned to the emergency room  physician that he was worried that his sodium level might be a little  bit low as he was told about a week ago that his sodium was down to 118.   About 2 weeks ago the patient was told by his cardiologist that this  sodium was 121.  The patient was advised 2 days ago to drink some  Gatorade and avoid free water. However, the patient likes drinking fair  amounts of water.  He said he had drinks about 3-4 twelve-ounce glasses  of water with each meal and also drinks about 4 cups of coffee every  morning.  A review of his records did indicate in the past actually  about 2 years ago in 2008 the patient was admitted to the hospital with  also hyponatremia with sodium 123. This appears to have been a recurrent  problem likely due to the patient's water consumption.  When the patient  was evaluated in the emergency room he was somewhat lethargic but  otherwise was following commands.  He had no evidence of seizure.   He denies any fevers  or chills.  No shortness of breath.  The patient  reports that his blood sugars have been well controlled with diet and  also his medications.  He denies any recent weight loss.   The patient has some diarrhea which he attributes to his Crohn disease  although it is much better.  He does not feel that the diarrhea is  severe enough to cause him to be dehydrated to the point that he will  become hyponatremic.  The patient is essentially actually somewhat  euvolemic.   The patient had a history of a left lung mass about 10 years ago which  was removed. The patient indicating that this was benign.  He has no  recent history of smoking.  The patient also has Crohn disease which  caused him to have a partial ileocolectomy.  He had a recent colonoscopy  back in April 2010 by Dr. Jena Gauss, the records of which I reviewed and are  essentially unremarkable.  The patient also has some mild anemia for  which he is required to be on iron pills which he reportedly takes about  3 times a day.   The patient reports that he takes cholestyramine, this helps with his  diarrhea.   REVIEW OF SYSTEMS:  As mentioned in the history of present illness  above.   PAST MEDICAL HISTORY:  1. History of renal cell cancer on the right kidney status post      partial nephrectomy.  2. Type 2 diabetes.  3. Dyslipidemia.  4. History of left lung mass status post resection  5. History of Crohn disease status post partial ileocolectomy.  6. Chronic anemia.   ALLERGIES:  The patient has allergy to CODEINE and OXYCODONE.   MEDICATIONS:  1. Nasonex nasal.  2. Loratadine 10 mg p.o. once a day.  3. Aspirin 81 mg p.o. once a day.  4. Vitamin B12 1000 mcg injection once a month.  5. Entocort EC oral 3 mg p.o. once a day.  6. Trazodone 150 mg at bedtime.  7. Metformin 1000 mg p.o. b.i.d.  8. Gemfibrozil 600 mg p.o. once a day.  9. Nexium 40 mg p.o. b.i.d.  10.Potassium chloride 1 tablet p.o. daily.   FAMILY HISTORY:   Noncontributory.   SOCIAL HISTORY:  The patient is married, lives with his wife.  He has no  children of his own. No recent history of alcohol or drug abuse.  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   PHYSICAL EXAM:  GENERAL:  The patient is conscious, alert, comfortable,  not in acute distress, well oriented to time, place and person.  VITAL SIGNS:  Blood pressure is 138/72 with a pulse of 86, respirations  14, temperature 98.4 degrees Fahrenheit, oxygen saturation was 100% on  room air.  HEENT: Normocephalic, atraumatic.  Oral mucosa is moist with no  exudates.  NECK: Supple.  No JVD, lymphadenopathy.  LUNGS:  Clear clinically with good air entry bilaterally.  HEART: S1-S2 regular.  No murmurs, gallops, or rubs.  ABDOMEN:  Soft, nontender.  Bowel sounds positive.  No masses palpable.  No tenderness is elicited.  EXTREMITIES:  No pitting pedal edema.  No calf induration or tenderness  is observed.  CNS: The patient is awake, alert, following commands and well-oriented  to time, place and person is very pleasant.   LABORATORY AND DIAGNOSTIC DATA:  White blood cell count was 9.2,  hemoglobin of 8.8, hematocrit was 25.9, MCV 69, platelet count 377.  This is consistent with an iron deficiency anemia.  Sodium was 115,  potassium 4.1, chloride of 85.  His CO2 was 22, glucose was 103, BUN was  7, creatinine was 0.66.  AST is 25, ALT of 19, albumin 4.1. Calcium was  8.7.  Cardiac enzymes were negative.  Stool for occult blood was  positive.   ASSESSMENT AND PLAN:  Mr. [name] is a 55 year old male who presented to  the emergency room with complaint of generalized weakness.  Patient was  found to have hyponatremia, although the patient also has some anemia  which I think is chronic.  I think the patient's symptoms are more  likely from hyponatremia, and this is more a chronic hyponatremia than  acute. Two weeks ago the patient reports that his sodium level was   actually 121 and about a week degree was down to 118.  I suspect that  his hyponatremia is more likely due to water intoxication, as the  patient drinks a fair amount of fluids and water every day.  The patient  consumes over a gallon of fluids daily.  The patient reports that he has  been told in the past to try to reduce his water intake.  He feels that  water is very good for him.   PLAN:  1. Will admit him to the medical intensive care unit as patient is at      risk for possible seizure.  2. Will restrict his water intake to only about 800 mL in 24 hours.  3. Will obtain workup for possible syndrome of inappropriate      antidiuretic hormone secretion.  I will send blood for serum      osmolality and also urine osmolality and will send for urine      sodium, urine creatinine.  4. I do not see any indication for any hypertonic saline at this time,      also no indication for IV fluids.  The patient is currently      euvolemic.  5. Will also continue the patient's home medications at this time.      Review of his medication list does not really indicate to me any      potential cause for his hyponatremia.  6. Will also request gastroenterology to see him for his Crohn      disease, although he has noacute exacerbation.  The patient had a      colonoscopy back in April which appeared to be normal.  7. The patient may need further evaluation regarding his anemia      although I suspect this is due to his Crohns.   I have indicated the above plan to the patient and discussed with him  water restriction, including ice chips and whatever fluids he drinks  will be restricted so that his sodium level can slowly move up.  Will  repeat his potassium level every 6 hours also.   Critical care time was 40 minutes.      Margaretmary Dys, M.D.  Electronically Signed     AM/MEDQ  D:  01/13/2009  T:  01/13/2009  Job:  [id number]

## 2010-10-14 NOTE — Assessment & Plan Note (Signed)
NAMEROGEN, PORTE                  CHART#:  [id number]   DATE:  05/24/2007                       DOB:  Nov 29, 1955   CHIEF COMPLAINT:  Ileocolonic Crohn's disease.   HISTORY OF PRESENT ILLNESS:  I last saw Mr. Darden in the office on  03/31/2007.  He was doing well on __________ Entocort.  He developed  acute illness characterized by nausea, vomiting, non-bloody diarrhea for  which he developed secondary __________ contraction and hypernatremia.  He was admitted to [facility] a couple of weeks ago.  He was  seen by Dr. Cira Servant.  It was felt he had __________ illness.  CT of the  abdomen demonstrated abnormality question in the area of the left kidney  (site of prior resection for renal cell carcinoma).  His general  symptoms were pretty much self-limited.  His Entocort was increased to 6  mg daily.  He was started on some Reglan 5 mg h.c. and h.s.  He has done  well since admission.  He saw Dr. Christin Fudge, his oncologist, up on  [place] recently.  Took the CD-ROM of his CT scan up there and  fortunately   Dictation Ended At This Point.       Jonathon Bellows, M.D.  Electronically Signed     RMR/MEDQ  D:  05/24/2007  T:  05/25/2007  Job:  [id number]

## 2010-10-14 NOTE — Assessment & Plan Note (Signed)
NAMELISSANDRO, DILORENZO                  CHART#:  [id number]   DATE:  04/22/2007                       DOB:  August 18, 1955   CHIEF COMPLAINT:  Followup hospitalization Crohn's flare/nausea and  vomiting.   SUBJECTIVE:  Mr. Carelock is a 55 year old Caucasian male. He was admitted  to [facility] and was seen in consultation by [place] on April 18, 2007. He has a history of ileocolonic Crohn's disease, diabetes  mellitus, chronic GERD and is status post left nephrectomy for renal  cell carcinoma. He did have a 2 week history of nausea and vomiting and  epigastric pain. While admitted to the hospital, he was found to have  hyponatremia. He had a CT scan, which corrected with IV fluids. He had a  CT scan of the abdomen and pelvis on April 18, 2007. He was found to  have mid ileal wall thickening without evidence of bowel obstruction.  Findings compatible with post-surgical and/or post-traumatic changes  involving the left upper quadrant including posterior peri-renal  partially calcified structure, raising the question of calcification,  rule out  hematoma versus fat necrosis. There was a soft tissue somewhat  mass like structurization to the anterior superior aspect of the left  kidney and pancreatic tail, etiology of which is uncertain. Oval to  elliptical shaped fluid attenuation structure, which was partially  calcified lying in the posterior aspect peritoneal cavity and adjacent  to the anterior pararenal fascia. He was also shown to have multiple  calcified gallstones on CT and diffuse fatty infiltration of the liver.  While in the hospital, he was started on Reglan, which he is taking 5 mg  b.i.d. He says he is doing quite well since he left the hospital. He was  taking his ill dog to the vet and began to have a sick feeling again  yesterday. This has now faded and he is feeling well again. He admits to  being under significant amount of financial stress. He tells me that he  had  previously been followed by Dr. Bari Mantis and Dr. Claude Manges down at  [facility] for his history of renal  cell carcinoma, although he was released several years ago and has not  had followup. He complains of some anxiety but denies any suicidal or  homicidal etiology. Denies any interference with his activities of daily  living. He is also taking Carafate 1 gram q.i.d. His bowel movements  have been about 2 to 3 times daily and solid. He denies any blood or  mucous in his stools. He is taking Trazodone at night to help him sleep.  He has not had any further vomiting since he left the hospital. He is  taking Cholestyramine once daily as needed for diarrhea. His weight is  down another 5 pounds since he was seen in our office on March 31, 2007. He is on Entocort 3 mg daily currently.   CURRENT MEDICATIONS:  See the list from April 22, 2007.   ALLERGIES:  OXYCODONE, PENTASA.   OBJECTIVE:  VITAL SIGNS:  Weight 183 pounds. Height 68 inches.  Temperature 97.7. Blood pressure 118/80 and pulse rate of 60.  GENERAL:  Mr. Brunkhorst is a well developed, well nourished, Caucasian male  in no acute distress.  HEENT:  Sclerae clear and nonicteric.  Conjunctivae pink. Oropharynx pink  and moist without any lesions.  CHEST/HEART:  Regular rate and rhythm. Normal S1 and S2. Without  murmurs, clicks, rubs, or gallops.  LUNGS:  Clear to auscultation bilaterally.  ABDOMEN:  Positive bowel sounds x4. No bruits auscultated. Soft,  nontender, and nondistended without palpable hepatosplenomegaly or mass.  No rebound, tenderness, or guarding.  EXTREMITIES:  Without clubbing or edema bilaterally.   ASSESSMENT:  Mr. Embleton is a 55 year old Caucasian male with acute onset  of nausea and vomiting, which is now resolved, with the addition of  Reglan. He is diabetic. Would be concerned about diabetic gastroparesis.  He also had some suspicious changes from possible ileal wall  thickening  and mild Crohn's flare on recent CT. He was shown to have multiple  cholelithiasis and he had hyponatremia.   There was a soft tissue mass-like structure adjacent to the anterior  superior aspect of the left kidney and pancreatic tail, the etiology  uncertain. Will have Dr. Jena Gauss review films with radiologist to  determine whether this needs followup with his oncologist and comparison  with recent CT films.   PLAN:  1. Dr. Jena Gauss to review films with the radiologist and make further      recommendations as to whether Mr. Erskine Emery should be seen by Dr.      Claude Manges down at [facility],      given his history of renal cell carcinoma.  2. Continue Reglan 5 mg before breakfast and dinner, #60 with 1      refill.  3. Continue Carafate 1 gram q.i.d. for a total of 2 weeks and then      discontinue.  4. Increase Entocort to 6 mg daily. I have given her a prescription      for 30 with 1 refill and he is to followup with Dr. Jena Gauss, pending      review of CT.       Lorenza Burton, N.P.  Electronically Signed     R. Roetta Sessions, M.D.  Electronically Signed    KJ/MEDQ  D:  04/22/2007  T:  04/23/2007  Job:  [id number]   cc:   Olen Pel

## 2010-10-14 NOTE — Assessment & Plan Note (Signed)
[name], [name]                  CHART#:  [id number]   DATE:  05/24/2007                       DOB:  07/30/1955   Follow-up ileocolonic Crohn's disease.  Last seen here 03/31/2007.  He  was doing well and we were going to plan to see him back in six months;  however, he developed acute illness characterized by nausea, vomiting,  and nonbloody diarrhea resulting in volume contractions and hyponatremia  necessitating hospitalization a couple of weeks ago at [facility], was  seen by Dr. Cira Servant and felt to have a foodborne illness.  He was started  on low-dose Reglan, his Anticort was bumped up to 6 mg daily.  He did  have a CT while he was there which demonstrated  abnormality in the area  of the left kidney, history of renal cell carcinoma on the left side  status post resection previously.  It was not clear as to whether or not  this was anything suspicious or not.  I personally reviewed the films  with Dr. Abelino Derrick.  We had none of his previous films as they were  all up in [place], [place].   At any rate, he has cute gastroenteritis resolved.  He is now back in  the office.  He took his CT here on CD Rom up to Dr. Claude Manges, his  oncologist in [place], and fortunately, the scan here looks entirely  stable compared to the prior scans and there is no need for concern at  this time.  Again, [name] is now doing well.  He is continuing on  Reglan 5 mg a.c. and h.s. and is taking Anticort 6 mg orally daily.  His  reflux symptoms are well-controlled on Nexium 40 mg orally b.i.d.   CURRENT MEDICATIONS:  See updated list.   ALLERGIES:  OXYCODONE AND PENTASA.   PHYSICAL EXAMINATION:  GENERAL:  He looks well.  VITAL SIGNS:  Weight:  185.  Height:  5 feet 8 inches.  Temperature:  97.9.  Blood pressure:  138/90.  Pulse:  64.  SKIN:  Warm and dry.  HEENT:  No scleral icterus.  CHEST:  Lungs are clear to auscultation.  HEART:  Regular rate and rhythm without murmur, gallop or  rub.  ABDOMEN:  Nondistended, positive bowel sounds, soft, and  nontender  without appreciable mass or organomegaly.   ASSESSMENT:  1. Ileocolonic Crohn's disease more or less in remission.  2. Recent self-limiting foodborne illness superimposed.   RECOMMENDATIONS:  1. Drop back to Anticort 3 mg orally daily for now, which is basically      nearly a homeopathic dose.  2. I have okayed him to go ahead and stop the Reglan, may continue      Nexium 40 mg orally twice daily if he needs this to control his      reflux symptoms.  3. He has been taking B12 injections.  He is out and has asked me to      provide him with a refill prescription which I will do today.      Unless something comes up, plan to see [place] back in six months time.       [name], M.D.  Electronically Signed     RMR/MEDQ  D:  05/24/2007  T:  05/25/2007  Job:  [id number]   cc:   Suzy Bouchard  Timothey Brotherton

## 2010-10-14 NOTE — Discharge Summary (Signed)
[name], [name]                 ACCOUNT NO.:  [id number]   MEDICAL RECORD NO.:  [id number]          PATIENT TYPE:  INP   LOCATION:  A332                          FACILITY:  [facility]   PHYSICIAN:  Osvaldo Shipper, MD     DATE OF BIRTH:  28-Jul-1955   DATE OF ADMISSION:  01/12/2009  DATE OF DISCHARGE:  08/17/2010LH                               DISCHARGE SUMMARY   DISCHARGE DIAGNOSES:  1. Hyponatremia secondary to increased water consumption.  2. Crohn's disease.  3. 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  4. Type 2 diabetes - well controlled.   CONSULTANTS:  Gastroenterology.  Dr. Roetta Sessions  saw the patient on  January 14, 2009.  In his comments he mentions that the patient had a  colonoscopy in April of 2010 that revealed some ileal ulcers.  He  recommends that the patient stay on his current dose of Entocort 3 mg  orally daily.  He also recommended an upper endoscopy for further  evaluation of his heme-positive anemia.   PERTINENT DIAGNOSTICS:  The patient had a CT scan of his head without  contrast on the date of admission.  This was a normal exam.   PERTINENT LABORATORY:  Admission serum sodium 115, discharge serum  sodium 125.  Hemoglobin today, prior to receiving 1 unit of packed red  blood cells, is 8.4 with an MCV value of 69.8.   HISTORY AND BRIEF HOSPITAL COURSE:  Mr. [name] is a 55 year old Caucasian  male with a history of renal cell carcinoma and Crohn'S disease who  presented to the [facility] emergency department on the 15th with  headache, weakness and lethargy.  He had been told by a doctor  approximately 1 week ago that his sodium was a little bit low and that  he should be drinking Gatorade rather than water.  His history revealed  that he does have a large water consumption.  His sodium was found to be  115 in the ED.  Looking back through E chart it appears that he has had  hyponatremia at least twice in the past 2 years.   The patient was admitted  to the intensive care unit secondary to seizure  risk.  His water intake was restricted.  He was placed on IV fluids.  His home medications were reviewed for any medicines that would cause  hyponatremia.  A GI consult was called for his anemia.  Over the next 24  hours the patient's sodium improved and he was moved to the floor.  With  regards to his hyponatremia,  given a diet water restriction and normal  and normal saline IV fluids, the patient's hyponatremia improved.  His  symptoms resolved and he is being discharged with a sodium of 125 to be  monitored by his primary care physician.  With regards to his anemia, he  was found to be guaiac positive x2.  He did receive 1 unit of packed red  blood cells before discharge.  He also had anemia panel during this  admission which revealed a serum iron of 23, serum ferritin of  4, total  iron binding capacity of 502.  Given his severe iron-deficient  microcytic anemia, he was placed on new iron b.i.d. and has been  scheduled for an upper endoscopy to be done tomorrow by Dr. Jena Gauss.  He  is also maintained on daily Protonix.  This morning, August 17th, the  patient tells me that he feels fine and requests discharge.  He will be  given 1 unit packed red blood cells prior to discharge.   PHYSICAL EXAMINATION:  His physical exam this morning is as follows:  Temperature 97.8, pulse 90, respirations 20, blood pressure is 135/77.  The patient is alert, oriented in no apparent distress.  He is a 52-year-  old Caucasian male who is well-developed, well-nourished, ambulating  around the hallway.  His head is normocephalic, atraumatic.  Eyes are  anicteric with pupils that are equal, round, reactive to light.  His  conjunctivae are pink.  His nose shows no exterior lesions.  He has no  nasal discharge.  His mouth shows no external lesions.  He has moist  mucous membranes.  His neck is supple.  Trachea midline.  He has no  apparent lymphadenopathy or JVD.   His chest shows no accessory muscle  use.  His lungs are clear to auscultation with no wheezes or crackles.  His heart has a regular rate and rhythm with normal S1-S2.  No murmurs,  rubs or gallops were appreciated.  His pulses are 2+ bilaterally in his  radial and his dorsalis pedis pulses.  His abdomen is soft, nontender,  nondistended with good bowel sounds.  Lower Extremities show no edema or  cyanosis.   DISPOSITION:  He will be discharged to home in stable condition.   DISCHARGE MEDICATIONS:  1. Nasonex 4 x a day.  2. Loratadine 10 mg once a day.  3. Vitamin B12 1000 mcg as directed.  4. Entocort 3 mg once daily.  5. Trazodone 150 mg at bedtime.  6. Metformin 1000 mg twice daily.  7. Gemfibrozil 600 mg daily.  8. Nexium 40 mg b.i.d.  9. Potassium chloride 99 mg daily.  10.Multivitamin one daily.  11.New iron 150 mg b.i.d.  12.Nasal saline to be used 4 times a day.  13.Cholestyramine one scoop daily as needed for diarrhea.  14.Viagra 100 mg daily as needed.   FOLLOWUP:  1. He will return she is primary care physician, Dr. Suzy Bouchard in      [place], [place] within the next 2 weeks.  2. He has an upper endoscopy scheduled with Dr. Roetta Sessions at [facility] tomorrow August 18th at approximately 12:30 p.m.  The      patient is instructed not to take his diabetic medications in the      morning, not      to eat or drink after midnight other than taking a small amount of      liquid for the rest of his oral medications in the morning.  He has      been instructed to come to short stay at 12:00 p.m. for his      preprocedure lab work that will include a serum sodium.      Stephani Police, [place]      Osvaldo Shipper, MD  Electronically Signed    MLY/MEDQ  D:  01/15/2009  T:  01/15/2009  Job:  [id number]   cc:   R. Roetta Sessions, M.D.  [address]  [place]  [place] [address]   Suzy Bouchard  Fax: [phone]
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
## 2010-10-14 NOTE — Op Note (Signed)
[name], [name]                 ACCOUNT NO.:  [id number]   MEDICAL RECORD NO.:  [id number]          PATIENT TYPE:  AMB   LOCATION:  DAY                           FACILITY:  [facility]   PHYSICIAN:  R. Roetta Sessions, M.D. DATE OF BIRTH:  Mar 12, 1956   DATE OF PROCEDURE:  09/24/2008  DATE OF DISCHARGE:                               OPERATIVE REPORT   PROCEDURE PERFORMED:  Colonoscopy/ileoscopy with biopsy.   INDICATIONS FOR PROCEDURE:  A 55 year old gentleman history of some  ileocolonic Crohn disease. Last colonoscopy was 4 to 5 years ago up in  [place].  He had some rare intermittent bouts of painless hematochezia  in a setting of 3 to 4 formed bowel movements daily.  No family history  of colon cancer.  No prior history of colonic polyps. Colonoscopy is now  being done.  Risks, benefits, alternatives and limitations have been  discussed.  Please see documentation in the medical record.   PROCEDURE NOTE:  O2 saturation, blood pressure, pulse and respiration  monitored the entire procedure.   CONSCIOUS SEDATION:  Versed 6 mg IV, Demerol 100 grams IV divided doses.   INSTRUMENTATION:  Pentax video chip system.   Digital rectal exam revealed no abnormalities.   ENDOSCOPIC FINDINGS:  The prep was adequate.  Colon:  Colonic mucosa was surveyed from the rectosigmoid junction all  way to the blunt end of the colon:  The proximal it has been surgically  altered.  There was no cecum, appendiceal orifice or ileocecal valve.  There was a small anastomosis of the small bowel that had an ulcer  straddling the Bovie, opening this approximately 1 cm I easily intubated  it, went up 10 cm into the neoterminal ileum.  There were 2 areas of  geographic ulceration present.  The majority of mucosa appeared  otherwise normal.  I did not really see any significant stricturing.  Ulcerated areas were biopsied for histologic analysis.  From this level  scope was slowly and cautiously withdrawn.  All  previous mentioned  mucosal surfaces were again seen.  The remainder of the residual colonic  mucosa appeared normal.  Scope was pulled down to rectum where thorough  examination of the rectal mucosa including retroflex view of the anal  verge demonstrated only some minimal internal hemorrhoids.  The patient  tolerated the procedure well, was reacted in endoscopy.   IMPRESSION:  1. Minimal internal hemorrhoids, otherwise normal rectum.  2. Status as right hemicolectomy with ulcerated neo ileal mucosa and      ileal colonic mucosa consistent with Crohn disease status post      biopsy.  Remainder of the residual colonic mucosa appeared normal.      I suspect the patient has bled from hemorrhoids.  He is on a low      dose of Entocort but clinically he is in a remission.  He does not      want to discuss immunosuppressive agents given his prior history of      renal cell carcinoma.  He has significant fear of      Imuran/azathioprine/6MP and  at this point in time, I would      certainly not discuss those agents further as at least he is      clinically in a remission although endoscopically and      histologically this may not be the case.  He does not want to ever      pursue any of those therapeutic options.   RECOMMENDATIONS:  1. Hemorrhoid literature also provided to Mr. Louthan.  A 10-day course      of Anusol HC suppositories one per rectum bedtime.  2. Further recommendations to follow pending review of path.      [name], M.D.  Electronically Signed     RMR/MEDQ  D:  09/24/2008  T:  09/24/2008  Job:  [id number]   cc:   Suzy Bouchard  Fax: [phone]

## 2010-10-14 NOTE — Assessment & Plan Note (Signed)
[name], [name]                  CHART#:  [id number]   DATE:  10/28/2006                       DOB:  [birth year]   OFFICE FOLLOWUP:  Followup for small bowel Crohn's disease.   Patient was last seen on September 06, 2006, at which time he underwent an  EGD for worsening gastroesophageal reflux.  These symptoms, despite  taking Nexium 40 mg orally b.i.d.  EGD demonstrated a normal esophagus,  small hiatal hernia, and questioned whether or not he may have had a  pill-induced injury or transient candidal infection.  At any rate, of  course, Carafate was associated with resolution of these symptoms.  He  is not really having any reflux on Nexium 40 mg orally b.i.d.   What is striking is that now he is off prednisone and he was tapered  over a one month period, and he has been on Entocort 6 mg daily.  His  need for oral hypoglycemics has dramatically dropped, and he is having  almost normal blood sugars, and on his own, he stopped taking Metformin.  He has still had some hyperglycemic episodes.  He wants to know if he  can stop his Glipizide ER and continue his Actos until he sees Dr.  Particia [name] on the 9th of next month.  He has had some intermittent  headaches and wonders about the cause.   As far as his Crohn's disease is concerned, he is really not having any  abdominal pain.  He is having 2-3 formed bowel movements daily and  overall feels the Entocort is working for him.   CURRENT MEDICATIONS:  See updated list.   ALLERGIES:  No known drug allergies.   FAMILY HISTORY:  Father died at age 39 from pancreatic cancer.  Otherwise negative for chronic GI or liver illness.   PHYSICAL EXAMINATION:  GENERAL:  He looks well.  VITAL SIGNS:  Weight 207.5.  He was 204.  Height 5 feet 8.  Temp 98.1.  BP 124/80.  Pulse 70.  ABDOMEN:  Flat.  Positive bowel sounds.  Soft, nontender without  appreciable mass or organomegaly.   ASSESSMENT:  Small bowel ileocolonic Crohn's disease, responsive  to low  dose Entocort at 6 mg daily.  He is off systemic corticosteroids, and  his blood sugars are reflecting that withdrawal.  I told him he could go  ahead and stop his glipizide, continue Actos, pending his followup with  Dr. Particia [name] over in [facility].  He is to watch his blood sugars  closely.  Will keep him on 6 mg of Entocort for the next eight weeks,  then try to drop him down to 1 daily thereafter.  He is not interested  in immunomodulator therapy, as he perceives.  This was associated with  development of renal cell carcinoma previously.  I have not really  discussed biologic therapy as of yet but will discuss it as an option in  the future.  He reports his bone density study was normal from November,  2007 in [place].  Have still not received that report.  Reflux  symptoms seem to be well controlled on Nexium twice daily.  I asked him  to drop back to Nexium 40 mg once daily.  Hopefully, his headaches will  resolve in the near future.  If they do not  settle down within two weeks  from now, he is to let me know, otherwise we will plan to see him back  in eight weeks.       [name], M.D.  Electronically Signed     RMR/MEDQ  D:  10/28/2006  T:  10/28/2006  Job:  [id number]   cc:   Dr. Dorna Leitz in [place], [place]

## 2010-10-14 NOTE — Op Note (Signed)
[name], [name]                 ACCOUNT NO.:  [id number]   MEDICAL RECORD NO.:  [id number]          PATIENT TYPE:  AMB   LOCATION:  DAY                           FACILITY:  [facility]   PHYSICIAN:  R. Roetta Sessions, M.D. DATE OF BIRTH:  1955-06-09   DATE OF PROCEDURE:  01/16/2009  DATE OF DISCHARGE:                               OPERATIVE REPORT   PROCEDURE PERFORMED:  Esophagogastroduodenoscopy with small bowel biopsy  of bronchi.   INDICATIONS FOR PROCEDURE:  A 55 year-old gentleman  with ileocolonic  Crohn's disease admitted to the hospital recently with mental status  changes secondary to hyponatremia. He has had quite a bit of dyspepsia  symptoms despite taking Nexium 40 mg orally twice daily.  He also has  iron-deficiency anemia and Hemoccult positive stool.  He is not taking  nonsteroidal agents.  He underwent an ileocolonoscopy back in April of  this year and was found to have some geographic ulcerations of his  neoterminal  ileum.  Clinically, however, he is felt to be in remission  as far as his Crohn's  disease is concerned.  EGD is now being done to  further evaluate this dyspepsia.  This approach has been discussed with  the patient at length.  Risks, benefits, alternatives and limitations  have been reviewed, questions answered.  Please see the documentation in  the medical record.  Please note follow-up sodium today was 127.   O2 saturation, blood pressure, pulse  and respirations were monitored  throughout the entire procedure.   CONSCIOUS SEDATION:  Versed 4 mg IV, Demerol 100 mg  IV  in divided  doses   INSTRUMENT USED:  Pentax video chip system.   FINDINGS:  Examination of tubular esophagus revealed no mucosal  abnormalities.  GE junction easily traversed.  Stomach:  Gastric cavity  was empty.  It insufflated well with air. A thorough examination of the  gastric mucosa including retroflexion of the proximal stomach and  esophagogastric junction demonstrated only  a small hiatal hernia.  Pylorus was patent and easily traversed.  Examination of the bulb and  second portion revealed no abnormalities.  Therapeutic/ diagnostic  maneuver was performed.  Biopsies of the second portion of duodenum were  really just done to make sure he does not have any villous atrophy.  The  patient tolerated the procedure well and was reactive after endoscopy.   IMPRESSION:  Normal esophagus, small hiatal hernia.  Otherwise normal  stomach D1 to D2, status post  biopsy  of D2.   RECOMMENDATIONS:  1. Continue Nexium.  2. Add Carafate suspension 1 gram p.o. a.c. and h.s. p.r.n. abdominal      distress.  3. Follow-up appointment with CBC in 3 months.   Depending on the course of his iron-deficiency anemia, if he has  progression of anemia, might give some consideration of studying his  small bowel with a capsule to demonstrate the extent of small bowel  ulceration.  If we take this approach I would have him swallow an Agile  capsule to document patency of the small bowel prior to employing  the  real thing.[name], M.D.  Electronically Signed     RMR/MEDQ  D:  01/16/2009  T:  01/16/2009  Job:  [id number]

## 2010-10-14 NOTE — Assessment & Plan Note (Signed)
NAMELAIN, TETTERTON                  CHART#:  [id number]   DATE:  03/31/2007                       DOB:  1956/02/12   Followup small bowel Crohn's disease.  Last seen 12/28/2006.  The  patient continues to do well on Entocort 3 mg daily.  He has 1-3 formed  bowel movements daily and is not really having any abdominal discomfort,  no rectal bleeding, etc.  Prior colonoscopy a couple of years ago by Dr.  __________ revealed small bowel Crohn's disease (ileocolonic Crohn's  disease).  Bone density study in [place] last year demonstrated  normal bone density.  Dr. Richardson Landry, his endocrinologist, stopped his  calcium supplement.  He has normal bone density.  He had some dyspeptic  symptoms, vague upper abdominal discomfort when he dropped back to  Nexium once daily.  He bumped it back up to twice daily and feels  better.  He has lost some weight.  He tells me his appetite is not as  good since coming off prednisone.  His hemoglobin A1c is now in the 6  range, and his blood pressure is now normal without Lisinopril which has  been stopped recently.  Prior EGD demonstrated only a small hiatal  hernia.  Overall, he is feeling well.  He does have a history of  osteoporosis, and he is currently not taking calcium supplements.  He is  getting some weight resistance exercises with some weights at home and  is trying to walk regularly.  He tells me his calcium intake amounts to  2 gallons of milk ingested weekly.   CURRENT MEDICATIONS:  See updated list.   ALLERGIES:  No known drug allergies.   PHYSICAL EXAMINATION:  He appears well.  Weight 188, which is down from 193 back in July.  Height 5 feet 8  inches, temp 98.1, BP 110/70, pulse 70.  ABDOMEN:  Flat, positive bowel sounds, soft, entirely nontender without  appreciable mass or organomegaly.   ASSESSMENT:  Small bowel Crohn's disease, in remission on low-dose  Entocort.  Previously intolerant to PENTASA.  Immunomodulator therapy  is  pretty much out of the question.  For the record, given his history of  renal cell carcinoma, he does not ever want to entertain going that  route, as he feels Imuran predisposed him to renal cell carcinoma (which  is a hard argument to refute).  He has a risk for osteopenia secondary  to Crohn's disease and relatively aggressive acid suppression regimen.  Recent bone density study is somewhat reassuring for the time being.   RECOMMENDATIONS:  Continue exercise regimen.  Will leave him on Entocort  3 mg daily.  Recent studies have shown that even at the 9 mg dose this  agent does not appear to have any significant association with  osteoporosis; however, having said that, he ought to have another bone  density study in 1 year from now.  I have given him refills on Entocort  3 mg daily, and Nexium 40 mg orally twice daily.  Hopefully, he will be  able to get by once daily proton pump  inhibitor eventually.  We will bring him back in 6 months to see how  things are going, and plan, again, for a bone density study in 1 year.       Jonathon Bellows,  M.D.  Electronically Signed     RMR/MEDQ  D:  03/31/2007  T:  03/31/2007  Job:  [id number]   cc:   [place], MD  Dorna Leitz, MD

## 2010-10-17 NOTE — H&P (Signed)
NAMEVINEETH, FELL                 ACCOUNT NO.:  [id number]   MEDICAL RECORD NO.:  [id number]         PATIENT TYPE:  AMB   LOCATION:                                FACILITY:  [facility]   PHYSICIAN:  R. Roetta Sessions, M.D. DATE OF BIRTH:  1956/01/04   DATE OF ADMISSION:  DATE OF DISCHARGE:  LH                              HISTORY & PHYSICAL   CHIEF COMPLAINT:  Refractory reflux symptoms.   Mr. Barnard Sharps is a 55 year old gentleman who has retrosternal burning  and regurgitation not well responsive to Nexium 40 mg b.i.d.  He saw an  ENT doctor in [place] and performed an esophagoscopy previously without  significant findings.  Crohn's disease felt to be in fairly good  remission on prednisone 7.5 mg daily.  He is status post ileocecectomy.  Colonoscopy by Dr. Despina Hidden one year ago demonstrated minimally active  Crohn's disease.  He has as his baseline of 4-6 bowel movements a day  with cholestyramine.  He is not taking immunosuppressive because of his  history of metastatic renal cell carcinoma.  He does not have any chest  pain, dyspnea on exertion.  No history of coronary disease.  His weight  is stable.   PAST MEDICAL HISTORY:  1. Left renal cell carcinoma, status post resection 2001.  2. Type 2 diabetes mellitus.  3. History of osteoporosis on Fosamax.  4. History of cataracts.  5. Crohn's disease.   PAST SURGICAL HISTORY:  1. Bilateral inguinal herniorrhaphy.  2. Left ear surgery.  3. Left leg surgery.  4. Right arm surgery for fracture.  5. Appendectomy.  6. Anterior cruciate repair right side.   CURRENT MEDICATIONS:  1. Prednisone 7.5 mg daily.  2. Glipizide ER 10 mg daily.  3. Actos 45 mg daily.  4. Lisinopril 5 mg daily.  5. Prenatal plus daily.  6. Iron 27 mg daily.  7. Vitamin C 500 mg daily.  8. Nexium 40 mg b.i.d.  9. Metformin 100 mg b.i.d.  10.Gemfibrozil 600 mg b.i.d.  11.Potassium 99 mg three times weekly.  12.B12 injections daily.  13.Cholestyramine  once daily.  14.Trazodone 50 mg at bedtime.   ALLERGIES:  No known drug allergies.  PENTASA causes nausea and  vomiting.   FAMILY HISTORY:  Father died at age 58 with pancreatic cancer.  Mother  is alive at age 76 with hypertension.  No history of chronic GI or liver  illness otherwise.   SOCIAL HISTORY:  The patient is married.  He has no children.  He is  disabled.  He was a Music therapist.  No tobacco, no alcohol, no illicit  drugs.   REVIEW OF SYSTEMS:  As in history of present illness.  Really does not  have any odynophagia.  Has not had any melena, rectal bleeding.   PHYSICAL EXAMINATION:  Bearded 55 year old gentleman in no acute  distress.  Weight 204, height 5 foot 8.  Blood pressure 130/84, pulse  84.  SKIN:  Warm and dry.  No jaundice.  CHEST:  Lungs are clear to auscultation.  CARDIAC EXAM:  Regular rate and rhythm without  murmur, rub, or gallop.  ABDOMEN:  Nondistended.  Positive bowel sounds.  Soft and nontender  without appreciable masses or organomegaly.  EXTREMITIES:  No edema.   IMPRESSION:  Mr. Dayshon Roback is a pleasant 55 year old gentleman on  chronic immunosuppressive therapy with prednisone with Crohn's disease,  now with apparently refractory reflux symptoms.  It does sound like he  is having more or less reflux/heartburn.  I do not think he has got it  does not sound like there are any cardiopulmonary issues ongoing at this  time.  We need to make sure he does not have Candida esophagitis or some  other process.  Pill-induced injury would also be a consideration.   RECOMMENDATIONS:  We will proceed with a diagnostic EGD in the near  future.  Potential risks, benefits, and alternatives have been reviewed  and he is to continue Nexium for the time being.  We will add Carafate 1  gram slurries q.i.d. to his regimen.  I told him that if I did not find  anything at the time of the EGD would consider Bravo pH monitoring while  on acid suppression therapy.   Further recommendations to follow.      Jonathon Bellows, M.D.  Electronically Signed     RMR/MEDQ  D:  08/17/2006  T:  08/17/2006  Job:  [id number]

## 2010-10-17 NOTE — Op Note (Signed)
[name], [name]                 ACCOUNT NO.:  [id number]   MEDICAL RECORD NO.:  [id number]          PATIENT TYPE:  AMB   LOCATION:  DAY                           FACILITY:  [facility]   PHYSICIAN:  R. Roetta Sessions, M.D. DATE OF BIRTH:  [birth year]   DATE OF PROCEDURE:  09/06/2006  DATE OF DISCHARGE:                               OPERATIVE REPORT   PROCEDURE:  Diagnostic EGD.   INDICATIONS FOR PROCEDURE:  A 55 year old gentleman with recent  worsening of gastroesophageal reflux disease despite taking Nexium 40 mg  orally b.i.d.  He has been on low-dose prednisone for years for  ileocolonic Crohn's disease.  I added Carafate in the office recently,  it has been associated with marked improvement in his symptoms.  Not  really having any true odynophagia.  He says he is much better at this  time.  An EGD is now being done to further evaluate his symptoms.  This  approach has been discussed with the patient at length.  The potential  risks, benefits, alternatives have been reviewed, questions answered, he  is agreeable; please see documentation in medical record.   PROCEDURE NOTE:  The O2 saturation, blood pressure, pulse and  respirations were monitored throughout entire procedure.   CONSCIOUS SEDATION:  1. Versed 4 mg IV.  2. Demerol 75 mg IV in divided doses.  3. Cetacaine spray for topical oropharyngeal anesthesia.   INSTRUMENT:  Pentax video chip system.   FINDINGS:  Examination of the tubular esophagus revealed entirely normal  mucosa, EG junction easily traversed.  The remaining stomach, colon,  gastric cavity was emptied and insufflated well with air.  A thorough  examination of the gastric mucosa including retroflexion of the proximal  stomach and esophagogastric junction demonstrated only a small hiatal  hernia.  Pylorus was patent and easily traversed.  Examination of the  bulb and second portion revealed no abnormalities.   THERAPEUTIC DIAGNOSTIC MANEUVERS PERFORMED:   None.   The patient tolerated the procedure well, was reactive to endoscopy.   IMPRESSION:  Normal esophagus, small hiatal hernia as well as normal  stomach, duodenum 1 and duodenum 2.   The patient's findings were reassuring.  No evidence of Candida  esophagitis or pill-induced injury.  He is markedly improved on  Carafate.  I wonder if he did not have some self-limiting Candida  esophagitis recently.  He may well have an element of gastroesophageal  reflux disease, so I doubt a Bravo pH probe would add much at this time.  Would like to get him off of systemic corticosteroids if at all  possible.  He was intolerant to Pentasa previously and immunosuppressive  modulators aside from prednisone are out of the question.   RECOMMENDATIONS:  Will go ahead and put him on some Entocort,  transitioning him over from prednisone.  Entocort two 6 mg tablets  daily.  I will have him drop back on his prednisone to 5 mg over the  next 2 weeks, then 2.5 mg for subsequent two weeks, then off.  Continuing Entocort 6 mg daily until he is seen in  the near future.  He  tells me he had a bone density study, given his history of osteoporosis,  last year in [place] and he was actually told that things looked  good and he was asked to stop his Fosamax and he was to have a bone  density study next year, will try to get those records.  I think it  would be better if he could get along without prednisone.  Low-dose  Senokot for long haul may well be a better customized approach for this  nice gentleman.  Will arrange to see him back in the office in 6 weeks.      [name], M.D.  Electronically Signed     RMR/MEDQ  D:  09/06/2006  T:  09/06/2006  Job:  [id number]   cc:   Dorna Leitz, Dr.  Newt Lukes, [place]

## 2010-10-20 NOTE — Op Note (Signed)
  [name], [name]                 ACCOUNT NO.:  [id number]  MEDICAL RECORD NO.:  [id number]           PATIENT TYPE:  O  LOCATION:  DAYP                          FACILITY:  [facility]  PHYSICIAN:  R. Roetta Sessions, M.D. DATE OF BIRTH:  [birth year]  DATE OF PROCEDURE:  10/13/2010 DATE OF DISCHARGE:                              OPERATIVE REPORT   INDICATIONS FOR PROCEDURE:  A 55 year old gentleman with longstanding ileocolonic Crohn disease and chronic anemia found to be Hemoccult positive recently. Clinically, the patient is in a remission because of his anemia Hemoccult positive.  Colonoscopy is now being done.  Risks, benefits, limitations, alternatives and imponderables have been discussed, questions answered.  Please see the documentation in the medical record.  PROCEDURE NOTE:  O2 saturation, blood pressure, pulse and respirations are monitored throughout the entirety of the procedure.  CONSCIOUS SEDATION: 1. Versed 8 mg IV. 2. Demerol 150 mg IV in divided doses.  INSTRUMENT:  Pentax video chip system.  FINDINGS:  Digital rectal exam revealed no abnormalities.  Endoscopic findings:  Prep was suboptimal, but doable.  Colon:  Colonic mucosa was surveyed from the rectosigmoid junction through the left transverse colon to the anastomosis of the small bowel.  The opening to the small bowel was approximately 7-8 mm would not admit the diagnostic gastroscope.  Small elliptical ulceration at the anastomosis was able to see upstream from the colon side into the small bowel.  The small bowel upstream for several centimeters looked good.  At the anastomosis, there was friability only one localized to small elliptical ulceration going right through the anastomotic channel.  Biopsies of this area were taken.  Otherwise, the anastomosis looked good.  From this level, scope was slowly and cautiously withdrawn.  All previously mentioned mucosal surfaces were again seen.  The patient had  scattered diverticula.  The remainder of the colonic mucosa appeared normal.  Scope was pulled down into the rectum, where a thorough examination of the rectal mucosa including retroflexed view of the anal verge demonstrated no abnormalities.  The patient tolerated the procedure well.  IMPRESSION: 1. Normal rectum. 2. Status post right hemicolectomy with small elliptical ulceration,     friability mucosa at the anastomosis, status post biopsy.  Few     scattered pancolonic diverticula. 3. I suspect the patient's anemia Hemoccult positive stools are based     on some degree of Crohn activity at the ileocolonic anastomosis.     However, clinically he is doing very well.  RECOMMENDATIONS: 1. Continue Entecort. 2. Avoid aspirin/nonsteroidals. 3. Followup on path. 4. Further recommendations to follow.     [name], M.D.     RMR/MEDQ  D:  10/13/2010  T:  10/14/2010  Job:  [id number]  cc:   Suzy Bouchard, MD Fax: [phone]  Electronically Signed by Lorrin Goodell M.D. on 10/20/2010 10:04:35 AM

## 2010-12-08 ENCOUNTER — Telehealth: Payer: Self-pay

## 2010-12-08 MED ORDER — BUDESONIDE 3 MG PO CP24: 6.0000 mg | ORAL_CAPSULE | ORAL | Status: DC

## 2010-12-08 NOTE — Telephone Encounter (Signed)
rx faxed

## 2010-12-08 NOTE — Telephone Encounter (Signed)
Pt needs refill on entocort 3mg  2qam. 90 day supply. Pt uses mail order and they need a written rx faxed to [phone]

## 2010-12-08 NOTE — Telephone Encounter (Signed)
Done  Please fax

## 2010-12-19 ENCOUNTER — Encounter: Payer: Self-pay | Admitting: Internal Medicine

## 2010-12-19 NOTE — Progress Notes (Unsigned)
  My audit of this chart reveals no documentation that I can find that the pathology results were conveyed to the patient.  Biopsies revealed only mild inflammation. Make sure his primary care physician gets a copy of the pathology report. Let's make sure patient has a followup appointment with [place]. Please check with the patient to see how he is doing and let him know the biopsies from his colonoscopy in May demonstrated only mild inflammation as seen at the time of colonoscopy

## 2010-12-22 NOTE — Progress Notes (Signed)
Called but no answer.

## 2010-12-30 NOTE — Progress Notes (Signed)
Tried to call pt- NA. Will mail letter.

## 2010-12-30 NOTE — Progress Notes (Signed)
Please cc pcp 

## 2010-12-30 NOTE — Progress Notes (Signed)
Cc to PCP 

## 2011-01-03 ENCOUNTER — Encounter (HOSPITAL_COMMUNITY): Payer: Self-pay | Admitting: *Deleted

## 2011-01-03 ENCOUNTER — Emergency Department (HOSPITAL_COMMUNITY)
Admission: EM | Admit: 2011-01-03 | Discharge: 2011-01-03 | Disposition: A | Discharge status: Home / Self Care | Payer: BC Managed Care – PPO | Attending: Emergency Medicine | Admitting: Emergency Medicine

## 2011-01-03 DIAGNOSIS — K219 Gastro-esophageal reflux disease without esophagitis: Secondary | ICD-10-CM | POA: Insufficient documentation

## 2011-01-03 DIAGNOSIS — Z8553 Personal history of malignant neoplasm of renal pelvis: Secondary | ICD-10-CM | POA: Insufficient documentation

## 2011-01-03 DIAGNOSIS — M810 Age-related osteoporosis without current pathological fracture: Secondary | ICD-10-CM | POA: Insufficient documentation

## 2011-01-03 DIAGNOSIS — Z79899 Other long term (current) drug therapy: Secondary | ICD-10-CM | POA: Insufficient documentation

## 2011-01-03 DIAGNOSIS — E119 Type 2 diabetes mellitus without complications: Secondary | ICD-10-CM | POA: Insufficient documentation

## 2011-01-03 DIAGNOSIS — K5090 Crohn's disease, unspecified, without complications: Secondary | ICD-10-CM | POA: Insufficient documentation

## 2011-01-03 DIAGNOSIS — K5289 Other specified noninfective gastroenteritis and colitis: Secondary | ICD-10-CM

## 2011-01-03 DIAGNOSIS — Z862 Personal history of diseases of the blood and blood-forming organs and certain disorders involving the immune mechanism: Secondary | ICD-10-CM | POA: Insufficient documentation

## 2011-01-03 DIAGNOSIS — I10 Essential (primary) hypertension: Secondary | ICD-10-CM | POA: Insufficient documentation

## 2011-01-03 DIAGNOSIS — E871 Hypo-osmolality and hyponatremia: Secondary | ICD-10-CM

## 2011-01-03 LAB — BASIC METABOLIC PANEL
Chloride: 85 mEq/L — ABNORMAL LOW (ref 96–112)
GFR calc Af Amer: >60 mL/min (ref >60)
GFR calc non Af Amer: >60 mL/min (ref >60)
Potassium: 3.9 mEq/L (ref 3.5–5.1)
Sodium: 121 mEq/L — ABNORMAL LOW (ref 135–145)

## 2011-01-03 MED ORDER — PROMETHAZINE HCL 25 MG PO TABS: 25.0000 mg | ORAL_TABLET | Freq: Four times a day (QID) | ORAL | Status: AC | PRN

## 2011-01-03 MED ORDER — ONDANSETRON HCL 4 MG/2ML IJ SOLN
4.0000 mg | Freq: Once | INTRAMUSCULAR | Status: AC
  Administered 2011-01-03: 4 mg via INTRAVENOUS
  Filled 2011-01-03: qty 2

## 2011-01-03 MED ORDER — SODIUM CHLORIDE 0.9 % IV SOLN
Freq: Once | INTRAVENOUS | Status: AC
  Administered 2011-01-03: 14:00:00 via INTRAVENOUS

## 2011-01-03 MED ORDER — KETOROLAC TROMETHAMINE 30 MG/ML IJ SOLN
30.0000 mg | Freq: Once | INTRAMUSCULAR | Status: AC
  Administered 2011-01-03: 30 mg via INTRAVENOUS
  Filled 2011-01-03: qty 1
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
## 2011-01-03 NOTE — ED Notes (Signed)
Pt c/o nausea, vomiting, diarrhea x 2 days. Also c/o headache and weakness. Pt seen at [facility] in [place] and given IV fluids, Zofran IV and Solumedrol IV. Pt had labs drawn and was told that he needed to be admitted.

## 2011-03-09 ENCOUNTER — Encounter: Payer: Self-pay | Admitting: Internal Medicine

## 2011-03-09 ENCOUNTER — Ambulatory Visit (INDEPENDENT_AMBULATORY_CARE_PROVIDER_SITE_OTHER): Payer: BC Managed Care – PPO | Admitting: Internal Medicine

## 2011-03-09 VITALS — BP 150/100 | HR 86 | Temp 97.1°F | Ht 69.0 in | Wt 188.4 lb

## 2011-03-09 DIAGNOSIS — K5080 Crohn's disease of both small and large intestine without complications: Secondary | ICD-10-CM

## 2011-03-09 DIAGNOSIS — K5090 Crohn's disease, unspecified, without complications: Secondary | ICD-10-CM

## 2011-03-09 DIAGNOSIS — E871 Hypo-osmolality and hyponatremia: Secondary | ICD-10-CM

## 2011-03-09 DIAGNOSIS — K50813 Crohn's disease of both small and large intestine with fistula: Secondary | ICD-10-CM

## 2011-03-09 NOTE — Assessment & Plan Note (Signed)
The patient has mildly active ileocolonic Crohn's disease endoscopically and histologically. Clinically, he is in remission. He is maintenance regimen includes Entocort 6 mg orally daily. He is not a good candidate nor is he interested in taking any of the biologic agents or immunosuppressive agent such as Imuran given his history of renal cell cancer.  I think at this point in time, we shall continue Enticort as the benefits outweighs the risks.  I doubt Enticort is playing a role his hyponatremia. He did have issues with hyponatremia prior to initiating Entocort therapy.  He has developed recurrent significant hyponatremia. I am not sure why he is having this problem. Hydrochlorothiazide previously stopped. He has not really been worked up for this problem. However, he's been hospitalized a couple times in the past 2 years. His sodium should gets down into the teens. He does have a relationship with an endocrinologist, Dr. Richardson Landry, over in [place]. He happens to have an appointment with him coming up in December.  I feel it would be a good idea for his hyponatremia to be addressed by Dr. Richardson Landry at his next visit. I told [name] I would see today's notes and the pertinent labs chronicling his hyponatremia over to Dr Richardson Landry.  Office followup here in 4 months.
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
## 2011-03-09 NOTE — Patient Instructions (Signed)
Continue Entocort for Crohn's disease.  Keep your appointment with Dr. Richardson Landry  Followup appointment here in 4 months

## 2011-03-10 LAB — BASIC METABOLIC PANEL
BUN: 7
BUN: 7
BUN: 8
BUN: 9
CO2: 21
CO2: 22
CO2: 24
Calcium: 8 — ABNORMAL LOW
Calcium: 8.2 — ABNORMAL LOW
Calcium: 8.2 — ABNORMAL LOW
Calcium: 8.9
Chloride: 94 — ABNORMAL LOW
Chloride: 96
Creatinine, Ser: 0.7
Creatinine, Ser: 0.73
Creatinine, Ser: 0.76
GFR calc non Af Amer: >60
GFR calc non Af Amer: >60
Glucose, Bld: 148 — ABNORMAL HIGH
Glucose, Bld: 172 — ABNORMAL HIGH
Glucose, Bld: 98
Potassium: 3.8

## 2011-03-10 LAB — DIFFERENTIAL
Lymphocytes Relative: 17
Monocytes Absolute: 0.8
Monocytes Relative: 8
Neutro Abs: 7.7

## 2011-03-10 LAB — STOOL CULTURE

## 2011-03-10 LAB — COMPREHENSIVE METABOLIC PANEL
Albumin: 3.8
Alkaline Phosphatase: 49
BUN: 7
GFR calc Af Amer: >60
Potassium: 4.6
Total Protein: 6.1

## 2011-03-10 LAB — CBC
HCT: 37.5 — ABNORMAL LOW
Platelets: 266
RDW: 13.1

## 2011-03-15 ENCOUNTER — Encounter (HOSPITAL_COMMUNITY): Payer: Self-pay | Admitting: *Deleted

## 2011-03-15 ENCOUNTER — Other Ambulatory Visit: Payer: Self-pay

## 2011-03-15 ENCOUNTER — Emergency Department (HOSPITAL_COMMUNITY): Payer: BC Managed Care – PPO

## 2011-03-15 ENCOUNTER — Inpatient Hospital Stay (HOSPITAL_COMMUNITY)
Admission: EM | Admit: 2011-03-15 | Discharge: 2011-03-21 | DRG: 297 | Disposition: A | Discharge status: Home / Self Care | Payer: BC Managed Care – PPO | Attending: Internal Medicine | Admitting: Internal Medicine

## 2011-03-15 DIAGNOSIS — K5090 Crohn's disease, unspecified, without complications: Secondary | ICD-10-CM | POA: Diagnosis present

## 2011-03-15 DIAGNOSIS — R112 Nausea with vomiting, unspecified: Secondary | ICD-10-CM | POA: Diagnosis present

## 2011-03-15 DIAGNOSIS — R0789 Other chest pain: Secondary | ICD-10-CM | POA: Diagnosis present

## 2011-03-15 DIAGNOSIS — I10 Essential (primary) hypertension: Secondary | ICD-10-CM | POA: Diagnosis present

## 2011-03-15 DIAGNOSIS — E871 Hypo-osmolality and hyponatremia: Principal | ICD-10-CM | POA: Diagnosis present

## 2011-03-15 DIAGNOSIS — K219 Gastro-esophageal reflux disease without esophagitis: Secondary | ICD-10-CM | POA: Diagnosis present

## 2011-03-15 DIAGNOSIS — G4700 Insomnia, unspecified: Secondary | ICD-10-CM | POA: Diagnosis present

## 2011-03-15 HISTORY — DX: Reserved for concepts with insufficient information to code with codable children: IMO0002

## 2011-03-15 LAB — COMPREHENSIVE METABOLIC PANEL
ALT: 35 U/L (ref 0–53)
Alkaline Phosphatase: 48 U/L (ref 39–117)
CO2: 22 mEq/L (ref 19–32)
GFR calc Af Amer: >90 mL/min (ref >90)
GFR calc non Af Amer: >90 mL/min (ref >90)
Glucose, Bld: 113 mg/dL — ABNORMAL HIGH (ref 70–99)
Potassium: 3.6 mEq/L (ref 3.5–5.1)
Sodium: 121 mEq/L — ABNORMAL LOW (ref 135–145)
Total Protein: 6.4 g/dL (ref 6.0–8.3)

## 2011-03-15 LAB — DIFFERENTIAL
Lymphocytes Relative: 21 % (ref 12–46)
Lymphs Abs: 2.4 10*3/uL (ref 0.7–4.0)
Neutrophils Relative %: 67 % (ref 43–77)

## 2011-03-15 LAB — CBC
Platelets: 206 10*3/uL (ref 150–400)
RBC: 4.34 MIL/uL (ref 4.22–5.81)
WBC: 11.4 10*3/uL — ABNORMAL HIGH (ref 4.0–10.5)

## 2011-03-15 MED ORDER — FENTANYL CITRATE 0.05 MG/ML IJ SOLN
100.0000 ug | Freq: Once | INTRAMUSCULAR | Status: AC
  Administered 2011-03-15: 100 ug via INTRAVENOUS
  Filled 2011-03-15: qty 2

## 2011-03-15 MED ORDER — ONDANSETRON HCL 4 MG/2ML IJ SOLN
4.0000 mg | Freq: Once | INTRAMUSCULAR | Status: AC
  Administered 2011-03-15: 4 mg via INTRAVENOUS
  Filled 2011-03-15: qty 2
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
## 2011-03-15 NOTE — ED Notes (Addendum)
Pt states his blood pressure has been running high x 1 wk. Pt also c/o mild chest pain and n/v x 2hr PTA. Pt describes dull pain in his chest. Rates pain a 2. Pt also has 2 bulging disc in his back and has been in a lot of pain x 1 month.

## 2011-03-16 DIAGNOSIS — R0789 Other chest pain: Secondary | ICD-10-CM | POA: Diagnosis present

## 2011-03-16 DIAGNOSIS — E871 Hypo-osmolality and hyponatremia: Secondary | ICD-10-CM | POA: Diagnosis present

## 2011-03-16 DIAGNOSIS — R072 Precordial pain: Secondary | ICD-10-CM

## 2011-03-16 LAB — GLUCOSE, CAPILLARY
Glucose-Capillary: 126 mg/dL — ABNORMAL HIGH (ref 70–99)
Glucose-Capillary: 130 mg/dL — ABNORMAL HIGH (ref 70–99)

## 2011-03-16 LAB — CBC
MCH: 29.5 pg (ref 26.0–34.0)
MCHC: 36.4 g/dL — ABNORMAL HIGH (ref 30.0–36.0)
Platelets: 206 10*3/uL (ref 150–400)
RDW: 12.6 % (ref 11.5–15.5)

## 2011-03-16 LAB — CARDIAC PANEL(CRET KIN+CKTOT+MB+TROPI)
CK, MB: 4.1 ng/mL — ABNORMAL HIGH (ref 0.3–4.0)
CK, MB: 3.9 ng/mL (ref 0.3–4.0)
Troponin I: <0.3 ng/mL (ref <0.30)
Troponin I: <0.3 ng/mL (ref <0.30)

## 2011-03-16 LAB — BASIC METABOLIC PANEL
BUN: 7 mg/dL (ref 6–23)
Calcium: 8.9 mg/dL (ref 8.4–10.5)
GFR calc Af Amer: >90 mL/min (ref >90)
GFR calc non Af Amer: >90 mL/min (ref >90)
Glucose, Bld: 126 mg/dL — ABNORMAL HIGH (ref 70–99)

## 2011-03-16 MED ORDER — TRAZODONE HCL 50 MG PO TABS
150.0000 mg | ORAL_TABLET | Freq: Every day | ORAL | Status: DC
  Administered 2011-03-16: 150 mg via ORAL
  Filled 2011-03-16: qty 3

## 2011-03-16 MED ORDER — SODIUM CHLORIDE 0.9 % IV SOLN
INTRAVENOUS | Status: DC
  Administered 2011-03-16: 03:00:00 via INTRAVENOUS

## 2011-03-16 MED ORDER — ASPIRIN EC 81 MG PO TBEC
81.0000 mg | DELAYED_RELEASE_TABLET | Freq: Every day | ORAL | Status: DC
  Administered 2011-03-16 – 2011-03-21 (×6): 81 mg via ORAL
  Filled 2011-03-16 (×6): qty 1

## 2011-03-16 MED ORDER — ONDANSETRON HCL 4 MG/2ML IJ SOLN
4.0000 mg | Freq: Four times a day (QID) | INTRAMUSCULAR | Status: DC | PRN
  Administered 2011-03-16 – 2011-03-18 (×5): 4 mg via INTRAVENOUS
  Filled 2011-03-16 (×5): qty 2

## 2011-03-16 MED ORDER — ACETAMINOPHEN 325 MG PO TABS
650.0000 mg | ORAL_TABLET | ORAL | Status: DC | PRN
  Administered 2011-03-16 – 2011-03-20 (×5): 650 mg via ORAL
  Filled 2011-03-16 (×5): qty 2

## 2011-03-16 MED ORDER — SODIUM CHLORIDE 0.9 % IV SOLN
8.0000 mg | Freq: Three times a day (TID) | INTRAVENOUS | Status: DC | PRN
  Filled 2011-03-16: qty 4

## 2011-03-16 MED ORDER — FERROUS SULFATE 325 (65 FE) MG PO TABS
325.0000 mg | ORAL_TABLET | Freq: Every day | ORAL | Status: DC
  Administered 2011-03-16 – 2011-03-21 (×6): 325 mg via ORAL
  Filled 2011-03-16 (×6): qty 1

## 2011-03-16 MED ORDER — TRAZODONE HCL 50 MG PO TABS
100.0000 mg | ORAL_TABLET | Freq: Every day | ORAL | Status: DC
  Administered 2011-03-16 – 2011-03-20 (×5): 100 mg via ORAL
  Filled 2011-03-16 (×5): qty 2

## 2011-03-16 MED ORDER — LISINOPRIL 10 MG PO TABS
40.0000 mg | ORAL_TABLET | Freq: Every day | ORAL | Status: DC
  Administered 2011-03-16 – 2011-03-21 (×6): 40 mg via ORAL
  Filled 2011-03-16: qty 3
  Filled 2011-03-16 (×2): qty 4
  Filled 2011-03-16: qty 1
  Filled 2011-03-16 (×2): qty 4

## 2011-03-16 MED ORDER — CHOLESTYRAMINE LIGHT 4 G PO PACK
4.0000 g | PACK | Freq: Every day | ORAL | Status: DC
  Administered 2011-03-16 – 2011-03-21 (×6): 4 g via ORAL
  Filled 2011-03-16 (×8): qty 1

## 2011-03-16 MED ORDER — POTASSIUM CHLORIDE CRYS ER 20 MEQ PO TBCR
20.0000 meq | EXTENDED_RELEASE_TABLET | Freq: Every day | ORAL | Status: DC
  Administered 2011-03-16: 20 meq via ORAL
  Filled 2011-03-16: qty 1

## 2011-03-16 MED ORDER — TRAMADOL HCL 50 MG PO TABS
50.0000 mg | ORAL_TABLET | Freq: Two times a day (BID) | ORAL | Status: DC | PRN
  Administered 2011-03-16: 50 mg via ORAL
  Filled 2011-03-16: qty 1

## 2011-03-16 MED ORDER — TRAMADOL HCL 50 MG PO TABS
50.0000 mg | ORAL_TABLET | Freq: Three times a day (TID) | ORAL | Status: DC | PRN
  Administered 2011-03-16 – 2011-03-21 (×9): 50 mg via ORAL
  Filled 2011-03-16 (×9): qty 1

## 2011-03-16 MED ORDER — ONDANSETRON HCL 4 MG/2ML IJ SOLN
INTRAMUSCULAR | Status: AC
  Filled 2011-03-16: qty 4

## 2011-03-16 MED ORDER — METOPROLOL TARTRATE 25 MG PO TABS
25.0000 mg | ORAL_TABLET | Freq: Two times a day (BID) | ORAL | Status: DC
  Administered 2011-03-16 (×2): 25 mg via ORAL
  Filled 2011-03-16 (×2): qty 1

## 2011-03-16 MED ORDER — PANTOPRAZOLE SODIUM 40 MG PO TBEC
40.0000 mg | DELAYED_RELEASE_TABLET | Freq: Every day | ORAL | Status: DC
  Administered 2011-03-16 – 2011-03-21 (×6): 40 mg via ORAL
  Filled 2011-03-16 (×6): qty 1

## 2011-03-16 MED ORDER — BUDESONIDE 3 MG PO CP24
6.0000 mg | ORAL_CAPSULE | ORAL | Status: DC
  Administered 2011-03-17 – 2011-03-21 (×5): 6 mg via ORAL
  Filled 2011-03-16 (×8): qty 2

## 2011-03-16 NOTE — Progress Notes (Signed)
UR chart review completed.  

## 2011-03-16 NOTE — Plan of Care (Signed)
Problem: Phase I Progression Outcomes Goal: Pain controlled with appropriate interventions Outcome: Progressing Pt c/o headache meds given, relief noted after meds  Goal: Hemodynamically stable Outcome: Progressing Sodium-121, wbc-11

## 2011-03-16 NOTE — H&P (Signed)
  [id number] 

## 2011-03-16 NOTE — Progress Notes (Signed)
Subjective:  Patient seen and examined, admitted with chest pain. Chest pain has improved, cardiac enzymes are negative so far. Patient also has h/o chronic hyponatremia, which has been undiagnosed. Patient has been on Trazodone for last 10 years which  Can cause hyponatremia.  Objective: Vital signs in last 24 hours: Temp:  [98.2 F (36.8 C)-98.4 F (36.9 C)] 98.4 F (36.9 C) (10/15 0643) Pulse Rate:  [70-80] 79  (10/15 0643) Resp:  [16-20] 16  (10/15 0643) BP: (150-166)/(90-98) 163/90 mmHg (10/15 0643) SpO2:  [97 %-98 %] 98 % (10/15 0643) Weight:  [82.55 kg (181 lb 15.8 oz)-82.555 kg (182 lb)] 181 lb 15.8 oz (82.55 kg) (10/15 0230) Weight change:  Last BM Date: 03/15/11  Intake/Output from previous day:       Physical Exam: General: Alert, awake, oriented x3, in no acute distress. HEENT: No bruits, no goiter. Chest : No chest wall tenderness on palpation. Heart: Regular rate and rhythm, without murmurs, rubs, gallops. Lungs: Clear to auscultation bilaterally. Abdomen: Soft, nontender, nondistended, positive bowel sounds. Extremities: No clubbing cyanosis or edema with positive pedal pulses. Neuro: Grossly intact, nonfocal.    Lab Results: Basic Metabolic Panel:  Basename 03/16/11 0410 03/15/11 2107  NA 120* 121*  K 3.7 3.6  CL 85* 85*  CO2 25 22  GLUCOSE 126* 113*  BUN 7 8  CREATININE 0.53 0.55  CALCIUM 8.9 8.8  MG -- --  PHOS -- --   Liver Function Tests:  [facility] 03/15/11 2107  AST 21  ALT 35  ALKPHOS 48  BILITOT 0.5  PROT 6.4  ALBUMIN 3.9    Basename 03/15/11 2107  LIPASE 39  AMYLASE --   No results found for this basename: AMMONIA:2 in the last 72 hours CBC:  Basename 03/16/11 0410 03/15/11 2107  WBC 11.7* 11.4*  NEUTROABS -- 7.6  HGB 13.0 12.9*  HCT 35.7* 35.3*  MCV 81.1 81.3  PLT 206 206   Cardiac Enzymes:  Basename 03/15/11 2108  CKTOTAL --  CKMB --  CKMBINDEX --  TROPONINI <0.30   BNP: No results found for this  basename: POCBNP:3 in the last 72 hours D-Dimer: No results found for this basename: DDIMER:2 in the last 72 hours CBG:  Basename 03/16/11 0724  GLUCAP 126*   Hemoglobin A1C: No results found for this basename: HGBA1C in the last 72 hours Fasting Lipid Panel: No results found for this basename: CHOL,HDL,LDLCALC,TRIG,CHOLHDL,LDLDIRECT in the last 72 hours Thyroid Function Tests: No results found for this basename: TSH,T4TOTAL,FREET4,T3FREE,THYROIDAB in the last 72 hours Anemia Panel: No results found for this basename: VITAMINB12,FOLATE,FERRITIN,TIBC,IRON,RETICCTPCT in the last 72 hours  No results found for this basename: ETH:2 in the last 72 hours  No results found for this or any previous visit (from the past 240 hour(s)).  Studies/Results: Dg Chest 2 View  03/15/2011  *RADIOLOGY REPORT*  Clinical Data: Chest pain and nausea.  History of  lung surgery for fungal infection.  CHEST - 2 VIEW  Comparison: 12/17/2009  Findings: Old bilateral rib fractures versus postoperative deformities.  Slight fibrosis in the lung bases.  Normal heart size and pulmonary vascularity.  No focal airspace consolidation. Hyperinflation suggesting emphysema.  No blunting of costophrenic angles.  Low clips in the right axilla.  Degenerative changes in the spine.  No significant changes since the previous study.  IMPRESSION: No evidence of active pulmonary disease.  Postoperative changes.  Original Report Authenticated By: Marlon Pel, M.D.    Medications: Scheduled Meds:   . aspirin EC  81 mg Oral Daily  . budesonide  6 mg Oral QAM  . fentaNYL  100 mcg Intravenous Once  . ferrous sulfate  325 mg Oral Q breakfast  . lisinopril  40 mg Oral Daily  . ondansetron      . ondansetron  4 mg Intravenous Once  . pantoprazole  40 mg Oral Daily  . potassium chloride SA  20 mEq Oral Daily  . traZODone  150 mg Oral QHS   Continuous Infusions:   . sodium chloride 75 mL/hr at 03/16/11 0231   PRN  Meds:.ondansetron (ZOFRAN) IV  Assessment/Plan:  Principal Problem:  *Chest pain, atypical:  Continue to monitor cardiac enzymes, though chest pain is better. Active Problems:  Chronic hyponatremia: Trazodone can cause hyponatremia, patient has earlier tried to get off trazodone , but it caused withdrawal reaction. Will cut down the dose of trazodone to 100 mg po daily.Also put on fluid restriction of 1200 ml/day. Will d/c IV fluids. Hypertension:  Add Metoprolol 25 mg po BID. Continue Lisinopril 40 mg po daily. Crohn's disease: Continue Entocort. Insomnia:  Continue Trazodone, will start tapering the dose due to hyponatremia. May need temazepam if symptoms get worse.   LOS: 1 day   Effrey Davidow S 03/16/2011, 7:57 AM

## 2011-03-16 NOTE — Progress Notes (Signed)
*  PRELIMINARY RESULTS* Echocardiogram 2D Echocardiogram has been performed.  [name] 03/16/2011, 4:19 PM 

## 2011-03-16 NOTE — H&P (Signed)
[name], [name]                 ACCOUNT NO.:  [id number]  MEDICAL RECORD NO.:  [id number]  LOCATION:  A212                          FACILITY:  [facility]  PHYSICIAN:  Tarry Kos, MD       DATE OF BIRTH:  01-Oct-1955  DATE OF ADMISSION:  03/15/2011 DATE OF DISCHARGE:  LH                             HISTORY & PHYSICAL   CHIEF COMPLAINT:  Chest pain.  HISTORY OF PRESENT ILLNESS:  [name] is a 55 year old male who has a history of Crohn's disease who presents emergency department with earlier episode of substernal chest pain that did not radiate anywhere that is now resolved.  He had some associated nausea and vomiting with it, no diaphoresis.  He is a diabetic.  He did have a stress test about 3 years ago, which was normal.  He denies any fevers, cough, any recent illnesses.  We are being asked to admit the patient for chest pain rule out and because his sodium is 121.  PAST MEDICAL HISTORY: 1. Crohn's disease. 2. Chronic anemia. 3. GERD. 4. Hematochezia due to his Crohn's. 5. No known history of coronary artery disease.  SOCIAL HISTORY:  He is a nonsmoker.  No alcohol, no IV drug abuse.  REVIEW OF SYSTEMS:  Otherwise negative.  ALLERGIES:  CODEINE causes itch.  MEDICATIONS:  Please see epic for full details of his medications.  It has been reviewed.  PHYSICAL EXAMINATION:  VITAL SIGNS:  Temperature is 98.4, pulse 80, respirations 16, blood pressure 150/90, 98% O2 sats on room air. GENERAL:  He is alert and oriented x4.  No apparent distress, cooperative and friendly. HEENT:  Extraocular movements intact.  Pupils equal and reactive to light.  Oropharynx clear.  Mucous membranes moist. NECK:  No JVD.  No carotid bruits. COR:  Regular rate and rhythm without murmurs or gallops. CHEST:  Clear to auscultation bilaterally.  No wheezes, rhonchi, or rales. ABDOMEN:  Soft, nontender, and nondistended.  Positive bowel sounds.  No hepatosplenomegaly. EXTREMITIES:  No clubbing,  cyanosis, or edema. PSYCH:  Normal mood and affect. NEURO:  No focal neurologic deficits. SKIN:  No rashes.  12-lead EKG is normal sinus rhythm without any acute ST or T-wave changes.  Sodium is 121.  He has a history of chronic hyponatremia of unclear etiology.  Troponin negative.  White count 11.4, hemoglobin 12.9.  Chest x-ray is negative.  ASSESSMENT/PLAN:  This is a 55 year old male with atypical chest pain and hyponatremia, which is asymptomatic. 1. Atypical chest pain in a patient with multiple risk factors.  We     will observe him overnight, rule him out with serial cardiac     enzymes and obtain a 2D echo.  He will need further outpatient     cardiac stress testing that will need to be arranged by primary     care physician.  [name] really does not want to stay overnight.     I have convinced him to stay overnight, but he is insisting on     being discharged tomorrow, so I think if we observe him and rule     him out, he will just need further outpatient  followup. 2. Hyponatremia asymptomatic.  I will place him on some IV fluids     overnight and repeat sodium level in the morning. 3. Hypertension, stable. 4. Hyperlipidemia, stable. 5. Diabetes, stable. 6. Crohn's disease, stable.  He is supposed to have an orthopedic appointment at 7:45 in the morning to get his knee checked out because he thinks he needs a knee replacement.  I have instructed him that he will have to reschedule that appointment.  He understands.  He is a full code.  Further recommendation pending over hospital course.                                           ______________________________ Tarry Kos, MD     RD/MEDQ  D:  03/16/2011  T:  03/16/2011  Job:  [id number]

## 2011-03-17 LAB — BASIC METABOLIC PANEL
BUN: 7 mg/dL (ref 6–23)
BUN: 6 mg/dL (ref 6–23)
CO2: 20 mEq/L (ref 19–32)
Chloride: 80 mEq/L — ABNORMAL LOW (ref 96–112)
Chloride: 73 mEq/L — ABNORMAL LOW (ref 96–112)
Creatinine, Ser: 0.53 mg/dL (ref 0.50–1.35)
Creatinine, Ser: 0.66 mg/dL (ref 0.50–1.35)
Glucose, Bld: 138 mg/dL — ABNORMAL HIGH (ref 70–99)
Glucose, Bld: 154 mg/dL — ABNORMAL HIGH (ref 70–99)
Potassium: 4.6 mEq/L (ref 3.5–5.1)

## 2011-03-17 LAB — MAGNESIUM: Magnesium: 1.7 mg/dL (ref 1.5–2.5)

## 2011-03-17 LAB — NA AND K (SODIUM & POTASSIUM), RAND UR: Potassium Urine: 47 mEq/L

## 2011-03-17 LAB — CBC
HCT: 34.9 % — ABNORMAL LOW (ref 39.0–52.0)
MCH: 29.3 pg (ref 26.0–34.0)
MCV: 80.4 fL (ref 78.0–100.0)
RDW: 12.6 % (ref 11.5–15.5)
WBC: 11 10*3/uL — ABNORMAL HIGH (ref 4.0–10.5)

## 2011-03-17 LAB — GLUCOSE, CAPILLARY
Glucose-Capillary: 159 mg/dL — ABNORMAL HIGH (ref 70–99)
Glucose-Capillary: 161 mg/dL — ABNORMAL HIGH (ref 70–99)

## 2011-03-17 LAB — CARDIAC PANEL(CRET KIN+CKTOT+MB+TROPI)
CK, MB: 3.9 ng/mL (ref 0.3–4.0)
Total CK: 167 U/L (ref 7–232)
Troponin I: <0.3 ng/mL (ref <0.30)

## 2011-03-17 LAB — OSMOLALITY, URINE: Osmolality, Ur: 622 mOsm/kg (ref 390–1090)

## 2011-03-17 LAB — SODIUM, URINE, RANDOM: Sodium, Ur: 178 mEq/L

## 2011-03-17 MED ORDER — HYDRALAZINE HCL 20 MG/ML IJ SOLN
10.0000 mg | INTRAMUSCULAR | Status: DC | PRN
  Administered 2011-03-17: 10 mg via INTRAVENOUS
  Filled 2011-03-17: qty 1

## 2011-03-17 MED ORDER — LORAZEPAM 2 MG/ML IJ SOLN
1.0000 mg | Freq: Once | INTRAMUSCULAR | Status: AC
  Administered 2011-03-17: 1 mg via INTRAVENOUS
  Filled 2011-03-17: qty 1

## 2011-03-17 MED ORDER — LABETALOL HCL 5 MG/ML IV SOLN
10.0000 mg | Freq: Once | INTRAVENOUS | Status: AC
  Administered 2011-03-17: 10 mg via INTRAVENOUS
  Filled 2011-03-17 (×2): qty 4

## 2011-03-17 MED ORDER — LORAZEPAM 2 MG/ML IJ SOLN
1.0000 mg | INTRAMUSCULAR | Status: DC | PRN
  Administered 2011-03-17 – 2011-03-20 (×7): 1 mg via INTRAVENOUS
  Filled 2011-03-17 (×7): qty 1

## 2011-03-17 MED ORDER — SODIUM CHLORIDE 0.9 % IV SOLN
INTRAVENOUS | Status: DC
  Administered 2011-03-17: 05:00:00 via INTRAVENOUS

## 2011-03-17 MED ORDER — FUROSEMIDE 10 MG/ML IJ SOLN
20.0000 mg | Freq: Two times a day (BID) | INTRAMUSCULAR | Status: DC
  Administered 2011-03-17 – 2011-03-21 (×9): 20 mg via INTRAVENOUS
  Filled 2011-03-17 (×9): qty 2

## 2011-03-17 MED ORDER — POTASSIUM CHLORIDE CRYS ER 20 MEQ PO TBCR
40.0000 meq | EXTENDED_RELEASE_TABLET | Freq: Once | ORAL | Status: AC
  Administered 2011-03-17: 40 meq via ORAL
  Filled 2011-03-17: qty 2

## 2011-03-17 MED ORDER — METOPROLOL TARTRATE 50 MG PO TABS
50.0000 mg | ORAL_TABLET | Freq: Two times a day (BID) | ORAL | Status: DC
  Administered 2011-03-17 – 2011-03-21 (×9): 50 mg via ORAL
  Filled 2011-03-17 (×9): qty 1

## 2011-03-17 MED ORDER — SODIUM CHLORIDE 0.9 % IJ SOLN
INTRAMUSCULAR | Status: AC
  Administered 2011-03-17: 13:00:00
  Filled 2011-03-17: qty 10

## 2011-03-17 MED ORDER — SODIUM CHLORIDE 0.9 % IJ SOLN
INTRAMUSCULAR | Status: AC
  Administered 2011-03-17: 3 mL
  Filled 2011-03-17: qty 3

## 2011-03-17 MED ORDER — POTASSIUM CHLORIDE IN NACL 20-0.9 MEQ/L-% IV SOLN
INTRAVENOUS | Status: DC
  Administered 2011-03-17: 13:00:00 via INTRAVENOUS

## 2011-03-17 MED ORDER — DEMECLOCYCLINE HCL 150 MG PO TABS
150.0000 mg | ORAL_TABLET | Freq: Three times a day (TID) | ORAL | Status: DC
  Administered 2011-03-17 – 2011-03-18 (×3): 150 mg via ORAL
  Filled 2011-03-17 (×11): qty 1

## 2011-03-17 MED ORDER — INFLUENZA VIRUS VACC SPLIT PF IM SUSP
0.5000 mL | Freq: Once | INTRAMUSCULAR | Status: AC
  Administered 2011-03-17: 0.5 mL via INTRAMUSCULAR
  Filled 2011-03-17 (×2): qty 0.5

## 2011-03-17 NOTE — Progress Notes (Signed)
Subjective: Patient seen sodium is lower today, dropped to 114. Patient also complains of cramps in the legs. No change in mental status.  Objective: Vital signs in last 24 hours: Temp:  [97.8 F (36.6 C)-98.7 F (37.1 C)] 97.8 F (36.6 C) (10/16 0409) Pulse Rate:  [69-84] 79  (10/16 0626) Resp:  [18-20] 18  (10/16 0626) BP: (148-203)/(92-116) 161/99 mmHg (10/16 0626) SpO2:  [97 %-100 %] 98 % (10/16 0626) Weight change:  Last BM Date: 03/16/11  Intake/Output from previous day: 10/15 0701 - 10/16 0700 In: 2499.5 [P.O.:1220; I.V.:1277.5; IV Piggyback:2] Out: -      Physical Exam: General: Alert, awake, oriented x3, in no acute distress. HEENT: No bruits, no goiter. Heart: Regular rate and rhythm, without murmurs, rubs, gallops. Lungs: Clear to auscultation bilaterally. Abdomen: Soft, nontender, nondistended, positive bowel sounds. Extremities: Cramps in calves, positive tender to palpation. Neuro: Grossly intact, nonfocal.    Lab Results: Basic Metabolic Panel:  Basename 03/17/11 0550 03/16/11 0410  NA 114* 120*  K 3.6 3.7  CL 80* 85*  CO2 20 25  GLUCOSE 138* 126*  BUN 7 7  CREATININE 0.53 0.53  CALCIUM 8.2* 8.9  MG -- --  PHOS -- --   Liver Function Tests:  [facility] 03/15/11 2107  AST 21  ALT 35  ALKPHOS 48  BILITOT 0.5  PROT 6.4  ALBUMIN 3.9    Basename 03/15/11 2107  LIPASE 39  AMYLASE --   No results found for this basename: AMMONIA:2 in the last 72 hours CBC:  Basename 03/17/11 0550 03/16/11 0410 03/15/11 2107  WBC 11.0* 11.7* --  NEUTROABS -- -- 7.6  HGB 12.7* 13.0 --  HCT 34.9* 35.7* --  MCV 80.4 81.1 --  PLT 209 206 --   Cardiac Enzymes:  Basename 03/17/11 0056 03/16/11 1613 03/16/11 0757  CKTOTAL 167 159 157  CKMB 3.9 3.9 4.1*  CKMBINDEX -- -- --  TROPONINI <0.30 <0.30 <0.30   BNP: No results found for this basename: POCBNP:3 in the last 72 hours D-Dimer: No results found for this basename: DDIMER:2 in the last 72  hours CBG:  Basename 03/16/11 2045 03/16/11 1628 03/16/11 1142 03/16/11 0724  GLUCAP 139* 171* 130* 126*    No results found for this or any previous visit (from the past 240 hour(s)).  Studies/Results: Dg Chest 2 View  03/15/2011  *RADIOLOGY REPORT*  Clinical Data: Chest pain and nausea.  History of  lung surgery for fungal infection.  CHEST - 2 VIEW  Comparison: 12/17/2009  Findings: Old bilateral rib fractures versus postoperative deformities.  Slight fibrosis in the lung bases.  Normal heart size and pulmonary vascularity.  No focal airspace consolidation. Hyperinflation suggesting emphysema.  No blunting of costophrenic angles.  Low clips in the right axilla.  Degenerative changes in the spine.  No significant changes since the previous study.  IMPRESSION: No evidence of active pulmonary disease.  Postoperative changes.  Original Report Authenticated By: Marlon Pel, M.D.    Medications: Scheduled Meds:   . aspirin EC  81 mg Oral Daily  . budesonide  6 mg Oral QAM  . cholestyramine light  4 g Oral Daily  . ferrous sulfate  325 mg Oral Q breakfast  . labetalol  10 mg Intravenous Once  . lisinopril  40 mg Oral Daily  . metoprolol tartrate  25 mg Oral BID  . pantoprazole  40 mg Oral Daily  . potassium chloride SA  20 mEq Oral Daily  . sodium chloride      .  traZODone  100 mg Oral QHS  . DISCONTD: traZODone  150 mg Oral QHS   Continuous Infusions:   . sodium chloride 0.9 % 1,000 mL infusion 100 mL/hr at 03/17/11 0430  . DISCONTD: sodium chloride 75 mL/hr at 03/16/11 0231   PRN Meds:.acetaminophen, hydrALAZINE, LORazepam, ondansetron (ZOFRAN) IV, traMADol, DISCONTD: ondansetron (ZOFRAN) IV, DISCONTD: traMADol  Assessment/Plan:  *Chest pain, atypical:  Resolved, cardiac enzymes x 3 are negative.  Active Problems:  Acute on Chronic hyponatremia:  Sodium dropped from 120 - 114, Will get Nephrology consult. Will follow urine osmolality, urine sodium and serum  osmolality. Patient does not have mental status changes at this time.Trazodone dose has been reduced to 100 mg po daily.Also on fluid restriction of 1200 ml/day.  Hypertension:  Increase  Metoprolol 50 mg po BID. Continue Lisinopril 40 mg po daily.  Crohn's disease:  Continue Entocort.  Insomnia:  Continue Trazodone, will start tapering the dose due to hyponatremia. May need temazepam if symptoms get worse. Leg cramps: Check stat magnesium level, will also give one dose of Po Potassium 40 meq Po X 1. Will give Ativan 1 mg IV X1  LOS: 2 days   LAMA,GAGAN S 03/17/2011, 7:40 AM

## 2011-03-17 NOTE — Progress Notes (Signed)
CRITICAL VALUE ALERT  Critical value received:  Serum Osmolality 234  Date of notification:  03/17/11  Time of notification:  1513  Critical value read back:Yes  Nurse who received alert:  L.Basilia Jumbo, RN  MD notified (1st page): [place]  Time of first page:  1514  MD notified (2nd page):  Time of second page:  Responding MD: Sharl Ma  Time MD responded: 1520

## 2011-03-17 NOTE — Progress Notes (Signed)
CRITICAL VALUE ALERT  Critical value received:  sodium  Date of notification:  03/17/11  Time of notification:  0700  Critical value read back:yes  Nurse who received alert:  P.J. Henderson Newcomer, RN  MD notified (1st page):  Drusilla Kanner  Time of first page:  0703  MD notified (2nd page):  Time of second page:  Responding MD:  Dr. Sharl Ma  Time MD responded:  [phone]
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
## 2011-03-18 LAB — BASIC METABOLIC PANEL
BUN: 8 mg/dL (ref 6–23)
BUN: 10 mg/dL (ref 6–23)
CO2: 22 mEq/L (ref 19–32)
Calcium: 8.6 mg/dL (ref 8.4–10.5)
Calcium: 8.8 mg/dL (ref 8.4–10.5)
Chloride: 78 mEq/L — ABNORMAL LOW (ref 96–112)
Chloride: 75 mEq/L — ABNORMAL LOW (ref 96–112)
Creatinine, Ser: 0.72 mg/dL (ref 0.50–1.35)
Creatinine, Ser: 0.79 mg/dL (ref 0.50–1.35)
GFR calc Af Amer: >90 mL/min (ref >90)
GFR calc non Af Amer: >90 mL/min (ref >90)

## 2011-03-18 LAB — GLUCOSE, CAPILLARY

## 2011-03-18 MED ORDER — SODIUM CHLORIDE 0.9 % IJ SOLN
INTRAMUSCULAR | Status: AC
  Administered 2011-03-18: 3 mL via INTRAVENOUS
  Filled 2011-03-18: qty 3

## 2011-03-18 MED ORDER — DEMECLOCYCLINE HCL 150 MG PO TABS
300.0000 mg | ORAL_TABLET | Freq: Three times a day (TID) | ORAL | Status: DC
  Administered 2011-03-18 – 2011-03-21 (×10): 300 mg via ORAL
  Filled 2011-03-18 (×16): qty 2

## 2011-03-18 MED ORDER — SODIUM CHLORIDE 0.9 % IV SOLN
Freq: Once | INTRAVENOUS | Status: AC
  Administered 2011-03-18: 18:00:00 via INTRAVENOUS

## 2011-03-18 MED ORDER — SODIUM CHLORIDE 1 G PO TABS
1.0000 g | ORAL_TABLET | Freq: Two times a day (BID) | ORAL | Status: DC
  Administered 2011-03-18 (×2): 1 g via ORAL
  Filled 2011-03-18 (×5): qty 1

## 2011-03-18 MED ORDER — SODIUM CHLORIDE 0.9 % IJ SOLN
3.0000 mL | Freq: Two times a day (BID) | INTRAMUSCULAR | Status: DC
  Administered 2011-03-18 – 2011-03-21 (×6): 3 mL via INTRAVENOUS
  Filled 2011-03-18 (×4): qty 3

## 2011-03-18 MED ORDER — ALPRAZOLAM 0.25 MG PO TABS
0.2500 mg | ORAL_TABLET | Freq: Two times a day (BID) | ORAL | Status: DC | PRN
  Administered 2011-03-18 – 2011-03-20 (×4): 0.25 mg via ORAL
  Filled 2011-03-18 (×4): qty 1

## 2011-03-18 MED ORDER — TOLVAPTAN 15 MG PO TABS
15.0000 mg | ORAL_TABLET | ORAL | Status: DC
  Filled 2011-03-18: qty 1

## 2011-03-18 MED ORDER — SODIUM CHLORIDE 0.9 % IJ SOLN
3.0000 mL | INTRAMUSCULAR | Status: DC | PRN
  Filled 2011-03-18: qty 3

## 2011-03-18 NOTE — Progress Notes (Signed)
Subjective:  Patient seen and examined, admitted with chest pain. Chest pain has improved.   Pt feels back to baseline.  No complaints.    Objective: Vital signs in last 24 hours: Temp:  [97.8 F (36.6 C)-99.2 F (37.3 C)] 99.2 F (37.3 C) (10/17 1401) Pulse Rate:  [72-84] 76  (10/17 1401) Resp:  [16-18] 18  (10/17 1401) BP: (124-154)/(79-96) 124/81 mmHg (10/17 1401) SpO2:  [96 %-98 %] 97 % (10/17 1401) Weight:  [82.283 kg (181 lb 6.4 oz)] 181 lb 6.4 oz (82.283 kg) (10/17 0500) Weight change:  Last BM Date: 03/16/11  Intake/Output from previous day: 10/16 0701 - 10/17 0700 In: 2257.1 [P.O.:860; I.V.:1387.1; IV Piggyback:10] Out: 1550 [Urine:1550] Total I/O In: 1917 [P.O.:940; I.V.:975; IV Piggyback:2] Out: 1300 [Urine:1300]   Physical Exam: General: Alert, awake, oriented x3, in no acute distress. HEENT: No bruits, no goiter. Chest : No chest wall tenderness on palpation. Heart: Regular rate and rhythm, without murmurs, rubs, gallops. Lungs: Clear to auscultation bilaterally. Abdomen: Soft, nontender, nondistended, positive bowel sounds. Extremities: No clubbing cyanosis or edema with positive pedal pulses. Neuro: Grossly intact, nonfocal.    Lab Results: Basic Metabolic Panel:  Basename 03/18/11 0457 03/17/11 2026 03/17/11 0752  NA 111* 111* --  K 5.0 4.6 --  CL 78* 73* --  CO2 22 24 --  GLUCOSE 135* 154* --  BUN 8 6 --  CREATININE 0.72 0.66 --  CALCIUM 8.6 9.0 --  MG -- -- 1.7  PHOS -- -- --   Liver Function Tests:  [facility] 03/15/11 2107  AST 21  ALT 35  ALKPHOS 48  BILITOT 0.5  PROT 6.4  ALBUMIN 3.9    Basename 03/15/11 2107  LIPASE 39  AMYLASE --   No results found for this basename: AMMONIA:2 in the last 72 hours CBC:  Basename 03/17/11 0550 03/16/11 0410 03/15/11 2107  WBC 11.0* 11.7* --  NEUTROABS -- -- 7.6  HGB 12.7* 13.0 --  HCT 34.9* 35.7* --  MCV 80.4 81.1 --  PLT 209 206 --   Cardiac Enzymes:  Basename 03/17/11 0056  03/16/11 1613 03/16/11 0757  CKTOTAL 167 159 157  CKMB 3.9 3.9 4.1*  CKMBINDEX -- -- --  TROPONINI <0.30 <0.30 <0.30   BNP: No results found for this basename: POCBNP:3 in the last 72 hours D-Dimer: No results found for this basename: DDIMER:2 in the last 72 hours CBG:  Basename 03/18/11 1130 03/18/11 0739 03/17/11 2140 03/17/11 1623 03/17/11 1134 03/17/11 0725  GLUCAP 173* 150* 177* 199* 161* 159*   Hemoglobin A1C: No results found for this basename: HGBA1C in the last 72 hours Fasting Lipid Panel: No results found for this basename: CHOL,HDL,LDLCALC,TRIG,CHOLHDL,LDLDIRECT in the last 72 hours Thyroid Function Tests: No results found for this basename: TSH,T4TOTAL,FREET4,T3FREE,THYROIDAB in the last 72 hours Anemia Panel: No results found for this basename: VITAMINB12,FOLATE,FERRITIN,TIBC,IRON,RETICCTPCT in the last 72 hours  No results found for this basename: ETH:2 in the last 72 hours  No results found for this or any previous visit (from the past 240 hour(s)).  Studies/Results: Dg Chest 2 View  03/15/2011  *RADIOLOGY REPORT*  Clinical Data: Chest pain and nausea.  History of  lung surgery for fungal infection.  CHEST - 2 VIEW  Comparison: 12/17/2009  Findings: Old bilateral rib fractures versus postoperative deformities.  Slight fibrosis in the lung bases.  Normal heart size and pulmonary vascularity.  No focal airspace consolidation. Hyperinflation suggesting emphysema.  No blunting of costophrenic angles.  Low clips in the  right axilla.  Degenerative changes in the spine.  No significant changes since the previous study.  IMPRESSION: No evidence of active pulmonary disease.  Postoperative changes.  Original Report Authenticated By: Marlon Pel, M.D.    Medications: Scheduled Meds:    . aspirin EC  81 mg Oral Daily  . budesonide  6 mg Oral QAM  . cholestyramine light  4 g Oral Daily  . demeclocycline  300 mg Oral TID BM  . ferrous sulfate  325 mg Oral Q  breakfast  . furosemide  20 mg Intravenous BID  . influenza  inactive virus vaccine  0.5 mL Intramuscular Once  . lisinopril  40 mg Oral Daily  . metoprolol tartrate  50 mg Oral BID  . pantoprazole  40 mg Oral Daily  . potassium chloride  40 mEq Oral Once  . sodium chloride  3 mL Intravenous Q12H  . sodium chloride  1 g Oral BID WC  . traZODone  100 mg Oral QHS  . DISCONTD: demeclocycline  150 mg Oral TID BM  . DISCONTD: tolvaptan  15 mg Oral Q24H   Continuous Infusions:    . DISCONTD: 0.9 % NaCl with KCl 20 mEq / L 75 mL/hr at 03/17/11 1329   PRN Meds:.acetaminophen, hydrALAZINE, LORazepam, ondansetron (ZOFRAN) IV, sodium chloride, traMADol  Assessment/Plan:   *Chest pain, atypical:  cardiac enzymes normal follow up outpatient chest pain resolved.   Chronic hyponatremia: Trazodone decreased to100 mg po daily as it can be a contributing factor to his hyponatremia,fluid restriction of 1200 ml/day. Nephrology consulted.  Patient now on Sodium tablets  Hypertension:  Metoprolol 25 mg po BID and Lisinopril 40 mg po daily.  Crohn's disease: Continue Entocort. Insomnia:  Trazodone being tapered dose due to hyponatremia. May need temazepam if symptoms get worse.   LOS: 3 days   Earlene Plater MD, [place]ell Pier 03/18/2011, 3:14 PM

## 2011-03-18 NOTE — Progress Notes (Signed)
Notified Dr Conley Rolls of pt's Na 111. Dr stated that he will defer change in treatment to a.m team.

## 2011-03-18 NOTE — Progress Notes (Signed)
[name]  MRN: [id number]  DOB/AGE: Jun 11, 1955 55 y.o.  Primary Care Physician:MILAM,JAMES T, MD, MD  Admit date: 03/15/2011  Chief Complaint:  Chief Complaint  Patient presents with  . Hypertension  . Chest Pain    S-Pt presented on  03/15/2011 with Chest Pian.    Pt ruled out.    Pt na lower than his baseline     Pt  Continues to have Nausea - NO emesis though      . aspirin EC  81 mg Oral Daily  . budesonide  6 mg Oral QAM  . cholestyramine light  4 g Oral Daily  . demeclocycline  150 mg Oral TID BM  . ferrous sulfate  325 mg Oral Q breakfast  . furosemide  20 mg Intravenous BID  . influenza  inactive virus vaccine  0.5 mL Intramuscular Once  . lisinopril  40 mg Oral Daily  . metoprolol tartrate  50 mg Oral BID  . pantoprazole  40 mg Oral Daily  . potassium chloride  40 mEq Oral Once  . sodium chloride      . traZODone  100 mg Oral QHS         NWG:NFAOZ from the symptoms mentioned above,there are no other symptoms referable to all systems reviewed.  Physical Exam: Vital signs in last 24 hours: Temp:  [97.8 F (36.6 C)-98.3 F (36.8 C)] 97.8 F (36.6 C) (10/17 0957) Pulse Rate:  [72-84] 84  (10/17 0957) Resp:  [16-18] 18  (10/17 0957) BP: (136-165)/(79-96) 136/87 mmHg (10/17 0957) SpO2:  [96 %-100 %] 97 % (10/17 0957) Weight:  [181 lb 6.4 oz (82.283 kg)] 181 lb 6.4 oz (82.283 kg) (10/17 0500) Weight change:  Last BM Date: 03/16/11  Intake/Output from previous day: 10/16 0701 - 10/17 0700 In: 862 [P.O.:860; IV Piggyback:2] Out: 1550 [Urine:1550] Total I/O In: 360 [P.O.:360] Out: 600 [Urine:600]   Physical Exam: General- pt is awake,alert, oriented to time place and person Resp- No acute REsp distress, CTA B/L NO Rhonchi CVS- S1S2 regular ij rate and rhythm GIT- BS+, soft, NT, ND EXT- NO LE Edema, Cyanosis   Lab Results: CBC  Basename 03/17/11 0550 03/16/11 0410  WBC 11.0* 11.7*  HGB 12.7* 13.0  HCT 34.9* 35.7*  PLT 209 206     BMET  Basename 03/18/11 0457 03/17/11 2026  NA 111* 111*  K 5.0 4.6  CL 78* 73*  CO2 22 24  GLUCOSE 135* 154*  BUN 8 6  CREATININE 0.72 0.66  CALCIUM 8.6 9.0    MICRO No results found for this or any previous visit (from the past 240 hour(s)).    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

## 2011-03-18 NOTE — Progress Notes (Addendum)
CRITICAL VALUE ALERT  Critical value received:  Sodium 110  Date of notification:  03/18/11  Time of notification:  2005  Critical value read back:yes  Nurse who received alert:  Lawson Fiscal RN  MD notified (1st page):  P. Le  Time of first page:  2010  MD notified (2nd page):  P. Le  Time of second page:  2025  Responding MD:  P. Le  Time MD responded:  2030

## 2011-03-18 NOTE — Progress Notes (Signed)
CRITICAL VALUE ALERT  Critical value received:  Na 111  Date of notification:  03/18/11  Time of notification:  0545  Critical value read back:yes  Nurse who received alert:  Matt Holmes  MD notified (1st page):  Dr Conley Rolls  Time of first page:  0600  MD notified (2nd page):n/a  Time of second page:n/a  Responding MD:  Dr Conley Rolls  Time MD responded:  [phone]

## 2011-03-19 LAB — GLUCOSE, CAPILLARY
Glucose-Capillary: 150 mg/dL — ABNORMAL HIGH (ref 70–99)
Glucose-Capillary: 149 mg/dL — ABNORMAL HIGH (ref 70–99)
Glucose-Capillary: 196 mg/dL — ABNORMAL HIGH (ref 70–99)

## 2011-03-19 LAB — CREATININE, URINE, RANDOM: Creatinine, Urine: 66.84 mg/dL

## 2011-03-19 LAB — OSMOLALITY, URINE: Osmolality, Ur: 306 mOsm/kg — ABNORMAL LOW (ref 390–1090)

## 2011-03-19 LAB — TSH: TSH: 3.019 u[IU]/mL (ref 0.350–4.500)

## 2011-03-19 LAB — CBC
HCT: 38.9 % — ABNORMAL LOW (ref 39.0–52.0)
MCH: 29.2 pg (ref 26.0–34.0)
MCV: 78.4 fL (ref 78.0–100.0)
Platelets: 232 10*3/uL (ref 150–400)
RDW: 12.8 % (ref 11.5–15.5)

## 2011-03-19 LAB — SODIUM, URINE, RANDOM: Sodium, Ur: 28 mEq/L

## 2011-03-19 LAB — BASIC METABOLIC PANEL
BUN: 12 mg/dL (ref 6–23)
CO2: 25 mEq/L (ref 19–32)
Calcium: 9.1 mg/dL (ref 8.4–10.5)
Chloride: 77 mEq/L — ABNORMAL LOW (ref 96–112)
Creatinine, Ser: 0.9 mg/dL (ref 0.50–1.35)
Glucose, Bld: 141 mg/dL — ABNORMAL HIGH (ref 70–99)

## 2011-03-19 MED ORDER — SODIUM CHLORIDE 1 G PO TABS
2.0000 g | ORAL_TABLET | Freq: Two times a day (BID) | ORAL | Status: DC
  Administered 2011-03-19 – 2011-03-20 (×2): 2 g via ORAL
  Filled 2011-03-19 (×2): qty 2

## 2011-03-19 MED ORDER — SODIUM CHLORIDE 0.9 % IJ SOLN
INTRAMUSCULAR | Status: AC
  Administered 2011-03-19: 20:00:00
  Filled 2011-03-19: qty 10
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
## 2011-03-19 NOTE — Progress Notes (Signed)
Subjective:  Patient seen and examined, admitted with chest pain. Patient is anxious to go home. Sodium is still low today. Per nephrology patient can be discharged once sodium greater than 120.   Objective: Vital signs in last 24 hours: Temp:  [97.6 F (36.4 C)-99.2 F (37.3 C)] 97.6 F (36.4 C) (10/18 0608) Pulse Rate:  [76-88] 88  (10/18 0608) Resp:  [18-20] 20  (10/18 0608) BP: (119-136)/(78-83) 119/78 mmHg (10/18 0608) SpO2:  [96 %-98 %] 96 % (10/18 4540) Weight change:  Last BM Date: 03/16/11  Intake/Output from previous day: 10/17 0701 - 10/18 0700 In: 1917 [P.O.:940; I.V.:975; IV Piggyback:2] Out: 1750 [Urine:1750]     Physical Exam: General: Alert, awake, oriented x3, in no acute distress. HEENT: No bruits, no goiter. Chest : No chest wall tenderness on palpation. Heart: Regular rate and rhythm, without murmurs, rubs, gallops. Lungs: Clear to auscultation bilaterally. Abdomen: Soft, nontender, nondistended, positive bowel sounds. Extremities: No clubbing cyanosis or edema with positive pedal pulses. Neuro: Grossly intact, nonfocal.    Lab Results: Basic Metabolic Panel:  Basename 03/19/11 0448 03/18/11 1911 03/17/11 0752  NA 113* 110* --  K 4.2 4.3 --  CL 77* 75* --  CO2 25 23 --  GLUCOSE 141* 182* --  BUN 12 10 --  CREATININE 0.90 0.79 --  CALCIUM 9.1 8.8 --  MG 1.8 -- 1.7  PHOS -- -- --   Liver Function Tests: No results found for this basename: AST:2,ALT:2,ALKPHOS:2,BILITOT:2,PROT:2,ALBUMIN:2 in the last 72 hours No results found for this basename: LIPASE:2,AMYLASE:2 in the last 72 hours No results found for this basename: AMMONIA:2 in the last 72 hours CBC:  Basename 03/19/11 0448 03/17/11 0550  WBC 11.4* 11.0*  NEUTROABS -- --  HGB 14.5 12.7*  HCT 38.9* 34.9*  MCV 78.4 80.4  PLT 232 209   Cardiac Enzymes:  Basename 03/17/11 0056 03/16/11 1613  CKTOTAL 167 159  CKMB 3.9 3.9  CKMBINDEX -- --  TROPONINI <0.30 <0.30   BNP: No  results found for this basename: POCBNP:3 in the last 72 hours D-Dimer: No results found for this basename: DDIMER:2 in the last 72 hours CBG:  Basename 03/19/11 0744 03/18/11 2048 03/18/11 1703 03/18/11 1130 03/18/11 0739 03/17/11 2140  GLUCAP 150* 192* 165* 173* 150* 177*   Hemoglobin A1C: No results found for this basename: HGBA1C in the last 72 hours Fasting Lipid Panel: No results found for this basename: CHOL,HDL,LDLCALC,TRIG,CHOLHDL,LDLDIRECT in the last 72 hours Thyroid Function Tests: No results found for this basename: TSH,T4TOTAL,FREET4,T3FREE,THYROIDAB in the last 72 hours Anemia Panel: No results found for this basename: VITAMINB12,FOLATE,FERRITIN,TIBC,IRON,RETICCTPCT in the last 72 hours  No results found for this basename: ETH:2 in the last 72 hours  No results found for this or any previous visit (from the past 240 hour(s)).  Studies/Results: Dg Chest 2 View  03/15/2011  *RADIOLOGY REPORT*  Clinical Data: Chest pain and nausea.  History of  lung surgery for fungal infection.  CHEST - 2 VIEW  Comparison: 12/17/2009  Findings: Old bilateral rib fractures versus postoperative deformities.  Slight fibrosis in the lung bases.  Normal heart size and pulmonary vascularity.  No focal airspace consolidation. Hyperinflation suggesting emphysema.  No blunting of costophrenic angles.  Low clips in the right axilla.  Degenerative changes in the spine.  No significant changes since the previous study.  IMPRESSION: No evidence of active pulmonary disease.  Postoperative changes.  Original Report Authenticated By: Marlon Pel, M.D.    Medications: Scheduled Meds:    .  sodium chloride   Intravenous Once  . aspirin EC  81 mg Oral Daily  . budesonide  6 mg Oral QAM  . cholestyramine light  4 g Oral Daily  . demeclocycline  300 mg Oral TID BM  . ferrous sulfate  325 mg Oral Q breakfast  . furosemide  20 mg Intravenous BID  . lisinopril  40 mg Oral Daily  . metoprolol  tartrate  50 mg Oral BID  . pantoprazole  40 mg Oral Daily  . sodium chloride  3 mL Intravenous Q12H  . sodium chloride  2 g Oral BID WC  . traZODone  100 mg Oral QHS  . DISCONTD: demeclocycline  150 mg Oral TID BM  . DISCONTD: sodium chloride  1 g Oral BID WC  . DISCONTD: tolvaptan  15 mg Oral Q24H   Continuous Infusions:   PRN Meds:.acetaminophen, ALPRAZolam, hydrALAZINE, LORazepam, ondansetron (ZOFRAN) IV, sodium chloride, traMADol  Assessment/Plan:   *Chest pain, atypical:  cardiac enzymes normal follow up outpatient chest pain resolved.   Chronic hyponatremia: Trazodone decreased to100 mg po daily as it can be a contributing factor to his hyponatremia,fluid restriction of 1200 ml/day. Nephrology consulted.  Patient on Sodium tablets and Democlocycline.  Per nephrology patient can be discharged once sodium greater than 120.  Hypertension:  Metoprolol 25 mg po BID and Lisinopril 40 mg po daily.   Crohn's disease: Continue Entocort. Insomnia:  Trazodone being tapered dose due to hyponatremia. May need temazepam if symptoms get worse.   LOS: 4 days   Earlene Plater MD, Ladell Pier 03/19/2011, 11:38 AM

## 2011-03-19 NOTE — Progress Notes (Signed)
Patient up ambulating in the with untied shoes.  Asked the patient if I could assist him in tying his shoes and he refused.  Explained to him that he might fall and I would like to help him and he stated that he didn't need any help.

## 2011-03-20 DIAGNOSIS — K5090 Crohn's disease, unspecified, without complications: Secondary | ICD-10-CM | POA: Diagnosis present

## 2011-03-20 DIAGNOSIS — R112 Nausea with vomiting, unspecified: Secondary | ICD-10-CM | POA: Diagnosis present

## 2011-03-20 LAB — GLUCOSE, CAPILLARY
Glucose-Capillary: 147 mg/dL — ABNORMAL HIGH (ref 70–99)
Glucose-Capillary: 159 mg/dL — ABNORMAL HIGH (ref 70–99)

## 2011-03-20 LAB — BASIC METABOLIC PANEL
BUN: 17 mg/dL (ref 6–23)
Chloride: 82 mEq/L — ABNORMAL LOW (ref 96–112)
Creatinine, Ser: 0.85 mg/dL (ref 0.50–1.35)
GFR calc Af Amer: >90 mL/min (ref >90)
GFR calc non Af Amer: >90 mL/min (ref >90)
Glucose, Bld: 133 mg/dL — ABNORMAL HIGH (ref 70–99)
Potassium: 3.8 mEq/L (ref 3.5–5.1)

## 2011-03-20 MED ORDER — SODIUM CHLORIDE 1 G PO TABS
2.0000 g | ORAL_TABLET | Freq: Three times a day (TID) | ORAL | Status: DC
  Administered 2011-03-20 – 2011-03-21 (×4): 2 g via ORAL
  Filled 2011-03-20 (×10): qty 2

## 2011-03-20 MED ORDER — SODIUM CHLORIDE 0.9 % IJ SOLN
INTRAMUSCULAR | Status: AC
  Administered 2011-03-20: 12:00:00
  Filled 2011-03-20: qty 10

## 2011-03-20 NOTE — Progress Notes (Signed)
Subjective: [name] is resting comfortably today. He denies chest pain fevers chills nausea or vomiting. He denies headache. He remains anxious to be discharged home but understands that we do not feel it is safe to do so until his sodium level is at least 120. He denies focal neurologic deficits paresthesias or paralysis.   Objective: Vital signs in last 24 hours: Temp:  [97.3 F (36.3 C)-97.8 F (36.6 C)] 97.8 F (36.6 C) (10/19 1416) Pulse Rate:  [72-84] 72  (10/19 1416) Resp:  [16-19] 18  (10/19 1416) BP: (108-125)/(77-84) 114/81 mmHg (10/19 1416) SpO2:  [94 %-97 %] 95 % (10/19 1416) Weight change:  Last BM Date: 03/16/11  Intake/Output from previous day: 10/18 0701 - 10/19 0700 In: 706 [P.O.:700; IV Piggyback:6] Out: 2200 [Urine:2200]     Physical Exam: General: Alert, awake, oriented x3, in no acute respiratory distress. Heart: Regular rate and rhythm, without murmurs, rubs, gallops. Lungs: Clear to auscultation bilaterally without wheeze or focal crackles Abdomen: Soft, nontender, nondistended, positive bowel sounds. Extremities: No clubbing cyanosis or edema with positive pedal pulses. Neuro: Grossly intact, non-focal - alert and oriented x4    Lab Results: Basic Metabolic Panel:  Basename 03/20/11 0513 03/19/11 0448  NA 117* 113*  K 3.8 4.2  CL 82* 77*  CO2 23 25  GLUCOSE 133* 141*  BUN 17 12  CREATININE 0.85 0.90  CALCIUM 9.2 9.1  MG -- 1.8  PHOS -- --   Liver Function Tests: No results found for this basename: AST:2,ALT:2,ALKPHOS:2,BILITOT:2,PROT:2,ALBUMIN:2 in the last 72 hours No results found for this basename: LIPASE:2,AMYLASE:2 in the last 72 hours No results found for this basename: AMMONIA:2 in the last 72 hours CBC:  Basename 03/19/11 0448  WBC 11.4*  NEUTROABS --  HGB 14.5  HCT 38.9*  MCV 78.4  PLT 232   Cardiac Enzymes: No results found for this basename: CKTOTAL:3,CKMB:3,CKMBINDEX:3,TROPONINI:3 in the last 72 hours BNP: No  results found for this basename: POCBNP:3 in the last 72 hours D-Dimer: No results found for this basename: DDIMER:2 in the last 72 hours CBG:  Basename 03/20/11 1100 03/20/11 0733 03/19/11 2058 03/19/11 1638 03/19/11 1128 03/19/11 0744  GLUCAP 159* 147* 142* 196* 149* 150*   Hemoglobin A1C: No results found for this basename: HGBA1C in the last 72 hours Fasting Lipid Panel: No results found for this basename: CHOL,HDL,LDLCALC,TRIG,CHOLHDL,LDLDIRECT in the last 72 hours Thyroid Function Tests:  Basename 03/19/11 0448  TSH 3.019  T4TOTAL --  FREET4 --  T3FREE --  THYROIDAB --   Anemia Panel: No results found for this basename: VITAMINB12,FOLATE,FERRITIN,TIBC,IRON,RETICCTPCT in the last 72 hours  No results found for this basename: ETH:2 in the last 72 hours  No results found for this or any previous visit (from the past 240 hour(s)).  Studies/Results: Dg Chest 2 View  03/15/2011  *RADIOLOGY REPORT*  Clinical Data: Chest pain and nausea.  History of  lung surgery for fungal infection.  CHEST - 2 VIEW  Comparison: 12/17/2009  Findings: Old bilateral rib fractures versus postoperative deformities.  Slight fibrosis in the lung bases.  Normal heart size and pulmonary vascularity.  No focal airspace consolidation. Hyperinflation suggesting emphysema.  No blunting of costophrenic angles.  Low clips in the right axilla.  Degenerative changes in the spine.  No significant changes since the previous study.  IMPRESSION: No evidence of active pulmonary disease.  Postoperative changes.  Original Report Authenticated By: Marlon Pel, M.D.    Medications: Scheduled Meds:    . aspirin  EC  81 mg Oral Daily  . budesonide  6 mg Oral QAM  . cholestyramine light  4 g Oral Daily  . demeclocycline  300 mg Oral TID BM  . ferrous sulfate  325 mg Oral Q breakfast  . furosemide  20 mg Intravenous BID  . lisinopril  40 mg Oral Daily  . metoprolol tartrate  50 mg Oral BID  . pantoprazole  40  mg Oral Daily  . sodium chloride  3 mL Intravenous Q12H  . sodium chloride      . sodium chloride      . sodium chloride  2 g Oral TID WC  . traZODone  100 mg Oral QHS  . DISCONTD: sodium chloride  2 g Oral BID WC   Continuous Infusions:   PRN Meds:.acetaminophen, ALPRAZolam, hydrALAZINE, LORazepam, ondansetron (ZOFRAN) IV, sodium chloride, traMADol  Assessment/Plan:   Chronic hyponatremia: Trazodone decreased to100 mg po daily as it can be a contributing factor to his hyponatremia - continue fluid restriction of 1200 ml/day. Patient remains on sodium tablets and Democlocycline.  Per Nephrology patient can be discharged once sodium greater than 120.  Chest pain, atypical: This issue has resolved - cardiac enzymes normal follow up outpatient chest pain resolved.  Hypertension:  Metoprolol 25 mg po BID and Lisinopril 40 mg po daily - blood pressure is currently well controlled - no changes today  Crohn's disease: Continue Entocort - no evidence of severe acute exacerbation  Insomnia:  Trazodone dose tapered due to hyponatremia. May need temazepam if symptoms get worse.  Disposition: As noted above [name] will be ready for discharge as soon as his sodium is 120 or greater and stable.   LOS: 5 days   [name] T 03/20/2011, 2:23 PM

## 2011-03-20 NOTE — Progress Notes (Signed)
Subjective: Interval History: has no complaint of nausea or vomiting. Patient denies any dizziness or lightheadedness. Overall he seems to be doing good..  Objective: Vital signs in last 24 hours: Temp:  [97.3 F (36.3 C)-97.7 F (36.5 C)] 97.5 F (36.4 C) (10/19 0457) Pulse Rate:  [73-84] 73  (10/19 0457) Resp:  [16-19] 19  (10/19 0457) BP: (108-125)/(77-84) 108/77 mmHg (10/19 0457) SpO2:  [94 %-97 %] 94 % (10/19 0457) Weight change:   Intake/Output from previous day: 10/18 0701 - 10/19 0700 In: 706 [P.O.:700; IV Piggyback:6] Out: 2200 [Urine:2200] Intake/Output this shift:    General appearance: alert and cooperative Resp: clear to auscultation bilaterally Cardio: regular rate and rhythm, S1, S2 normal, no murmur, click, rub or gallop GI: soft, non-tender; bowel sounds normal; no masses,  no organomegaly Extremities: extremities normal, atraumatic, no cyanosis or edema  Lab Results:  [facility] 03/19/11 0448  WBC 11.4*  HGB 14.5  HCT 38.9*  PLT 232   BMET:  Basename 03/20/11 0513 03/19/11 0448  NA 117* 113*  K 3.8 4.2  CL 82* 77*  CO2 23 25  GLUCOSE 133* 141*  BUN 17 12  CREATININE 0.85 0.90  CALCIUM 9.2 9.1   No results found for this basename: PTH:2 in the last 72 hours Iron Studies: No results found for this basename: IRON,TIBC,TRANSFERRIN,FERRITIN in the last 72 hours  Studies/Results: No results found.  I have reviewed the patient's current medications.  Assessment/Plan: Problem #1 hyponatremia at this moment seems to be secondary to [facility] patient on demeclocycline and sodium tablet his sodium has improved to 117 . Patient presently is symptomatic. Problem #2 history of her hypertension blood pressure seems to be reasonably controlled Problem #3 Crohn's disease number of her bowel movements has declined. Problem #4 history of her GERD Problem #5 history of anemia his hemoglobin and hematocrit is good. Recommendation we'll continue his present  management We'll check his basic metabolic panel and the we'll keep patient until her sodium is level. We'll increase his the sodium tablet to 2 g per 3 times a day.   LOS: 5 days   [name] S 03/20/2011,10:30 AM

## 2011-03-21 LAB — GLUCOSE, CAPILLARY: Glucose-Capillary: 181 mg/dL — ABNORMAL HIGH (ref 70–99)

## 2011-03-21 LAB — BASIC METABOLIC PANEL
BUN: 17 mg/dL (ref 6–23)
CO2: 26 mEq/L (ref 19–32)
Calcium: 9.4 mg/dL (ref 8.4–10.5)
Chloride: 90 mEq/L — ABNORMAL LOW (ref 96–112)
Creatinine, Ser: 0.92 mg/dL (ref 0.50–1.35)

## 2011-03-21 MED ORDER — TRAZODONE HCL 100 MG PO TABS: 100.0000 mg | ORAL_TABLET | Freq: Every day | ORAL | Status: AC

## 2011-03-21 MED ORDER — DEMECLOCYCLINE HCL 150 MG PO TABS: 300.0000 mg | ORAL_TABLET | Freq: Three times a day (TID) | ORAL | Status: AC

## 2011-03-21 NOTE — Discharge Summary (Signed)
DISCHARGE SUMMARY  [name]  MR#: [id number]  DOB:[birth year]  Date of Admission: 03/15/2011 Date of Discharge: 03/21/2011  Attending Physician:Helga Asbury T  Patient's BJY:NWGNF,AOZHY T, MD, MD  Consults:Treatment Team:  Jamse Mead  Discharge Diagnoses:  .Chronic hyponatremia - much improved with medical treatment  .Chest pain, atypical - resolved with negative workup  .Crohn's disease - compensated at present time  .Nausea & vomiting - likely due to hyponatremia - resolved    Current Discharge Medication List    START taking these medications   Details  demeclocycline (DECLOMYCIN) 150 MG tablet Take 2 tablets (300 mg total) by mouth 3 (three) times daily between meals. Qty: 90 tablet, Refills: 0      CONTINUE these medications which have CHANGED   Details  traZODone (DESYREL) 100 MG tablet Take 1 tablet (100 mg total) by mouth at bedtime. Qty: 30 tablet, Refills: 0      CONTINUE these medications which have NOT CHANGED   Details  budesonide (ENTOCORT EC) 3 MG 24 hr capsule Take 2 capsules (6 mg total) by mouth every morning. Qty: 180 capsule, Refills: 3    cholestyramine (QUESTRAN) 4 GM/DOSE powder Take 4 g by mouth daily.     cyclobenzaprine (FLEXERIL) 10 MG tablet Take 10 mg by mouth every 8 (eight) hours as needed. For muscle spasms     esomeprazole (NEXIUM) 40 MG capsule Take 1 capsule (40 mg total) by mouth 2 (two) times daily. Qty: 180 capsule, Refills: 0    ferrous sulfate 325 (65 FE) MG tablet Take 325 mg by mouth daily with breakfast.      levocetirizine (XYZAL) 5 MG tablet Take 5 mg by mouth every evening.      lisinopril (PRINIVIL,ZESTRIL) 20 MG tablet Take 40 mg by mouth daily.      metFORMIN (GLUCOPHAGE) 1000 MG tablet Take 1,000 mg by mouth 2 (two) times daily with a meal.      multivitamin (THERAGRAN) per tablet Take 1 tablet by mouth daily.      sildenafil (VIAGRA) 100 MG tablet Take 100 mg by mouth daily as needed. For  erectile dysfunction     traMADol (ULTRAM) 50 MG tablet Take 50 mg by mouth every 6 (six) hours as needed.     cyanocobalamin (,VITAMIN B-12,) 1000 MCG/ML injection Inject 1 mL (1,000 mcg total) into the muscle every 30 (thirty) days. Qty: 1 mL, Refills: 5      STOP taking these medications     mometasone (NASONEX) 50 MCG/ACT nasal spray      potassium chloride SA (K-DUR,KLOR-CON) 20 MEQ tablet      ferrous gluconate (FERGON) 325 MG tablet      gemfibrozil (LOPID) 600 MG tablet      hydrochlorothiazide 25 MG tablet      loratadine (CLARITIN) 10 MG tablet      Multiple Vitamin (MULTIVITAMIN) capsule      potassium chloride (KLOR-CON) 10 MEQ CR tablet      UNABLE TO [place] Hospital Course:  .Chronic hyponatremia The patient presented to the emergency room initially with chest pain. He was however found to be suffering with severe hyponatremia. This was noted to be a chronic issue but it was felt to be acutely exacerbated. Ultimately this proved to be the source of the majority of the patient's symptoms to include weakness fatigue nausea and some intermittent vomiting. A nephrology consultation was carried out.  The patient was treated  with fluid restriction and oral sodium load and the initiation of Declomycin. With these interventions his sodium level has improved quite nicely to the below noted level. The patient has been cleared for discharge from the medical service. Oral sodium replacement has been discontinued. The patient has been advised to adhere to a fluid restriction the outpatient setting.  He will continue to Declomycin treatment. He is to followup with the nephrologist as noted in this discharge summary.   .Chest pain, atypical At the time of the patient's initial presentation he reported a self-limited episode of substernal chest pain. His symptoms were somewhat atypical for unstable angina pectoris. Cardiac enzymes were cycled and serial EKGs were  accomplished. There was no indication of acute coronary syndrome and the patient's symptoms resolved. Further outpatient risk stratification should be considered by his primary care physician.  .Crohn's disease The patient was maintained on his usual home regimen for his Crohn's disease. His Crohn's remained quiescent throughout his hospital stay.  .Nausea & vomiting The patient's nausea and vomiting was most likely a sequelae of his hyponatremia.  With correction of his low sodium his symptoms resolved.   Day of Discharge BP 116/73  Pulse 77  Temp(Src) 98.6 F (37 C) (Oral)  Resp 18  Ht 5\' 9"  (1.753 m)  Wt 82.283 kg (181 lb 6.4 oz)  BMI 26.79 kg/m2  SpO2 96%  Physical Exam: Gen.: No acute respiratory distress Lungs: Clear to auscultation bilaterally without wheezes or rhonchi Cardiovascular: Regular rate and rhythm without murmur gallop or rub Abdomen: Nontender nondistended soft bowel sounds present no organomegaly no rebound no ascites Extremities: No significant cyanosis clubbing or edema bilateral lower extremity Neurologic: Alert and oriented x4, cranial nerves II through XII intact bilaterally, nonfocal exam  Results for orders placed during the hospital encounter of 03/15/11 (from the past 24 hour(s))  GLUCOSE, CAPILLARY     Status: Abnormal   Collection Time   03/20/11  4:25 PM      Component Value Range   Glucose-Capillary 161 (*) 70 - 99 (mg/dL)   Comment 1 Notify RN     Comment 2 Documented in Chart    GLUCOSE, CAPILLARY     Status: Abnormal   Collection Time   03/20/11  9:06 PM      Component Value Range   Glucose-Capillary 194 (*) 70 - 99 (mg/dL)   Comment 1 Notify RN    BASIC METABOLIC PANEL     Status: Abnormal   Collection Time   03/21/11  6:05 AM      Component Value Range   Sodium 126 (*) 135 - 145 (mEq/L)   Potassium 3.6  3.5 - 5.1 (mEq/L)   Chloride 90 (*) 96 - 112 (mEq/L)   CO2 26  19 - 32 (mEq/L)   Glucose, Bld 147 (*) 70 - 99 (mg/dL)   BUN 17   6 - 23 (mg/dL)   Creatinine, Ser 1.61  0.50 - 1.35 (mg/dL)   Calcium 9.4  8.4 - 09.6 (mg/dL)   GFR calc non Af Amer >90  >90 (mL/min)   GFR calc Af Amer >90  >90 (mL/min)  GLUCOSE, CAPILLARY     Status: Abnormal   Collection Time   03/21/11  7:27 AM      Component Value Range   Glucose-Capillary 145 (*) 70 - 99 (mg/dL)   Comment 1 Notify RN     Comment 2 Documented in Chart    GLUCOSE, CAPILLARY     Status: Abnormal  Collection Time   03/21/11 11:07 AM      Component Value Range   Glucose-Capillary 181 (*) 70 - 99 (mg/dL)   Comment 1 Notify RN     Comment 2 Documented in Chart      Disposition: The patient is clinically stable and quite anxious to be discharged home. He will be discharged home today with follow recommendations as detailed above.   Follow-up Appts: Discharge Orders    Future Orders Please Complete By Expires   Increase activity slowly      Call MD for:  persistant nausea and vomiting      Call MD for:  persistant dizziness or light-headedness      Call MD for:  extreme fatigue          Tests Needing Follow-up: The patient is to followup with the nephrologist in 3 weeks.  At that time a repeat Bement will be required.  SignedJetty Duhamel T 03/21/2011, 2:25 PM

## 2011-03-21 NOTE — Progress Notes (Signed)
Subjective: Interval History: has no complaint of dizziness or lightheadedness and. Patient offers no complaints..  Objective: Vital signs in last 24 hours: Temp:  [97.8 F (36.6 C)-98.3 F (36.8 C)] 98.3 F (36.8 C) (10/20 0522) Pulse Rate:  [72-93] 78  (10/20 0522) Resp:  [18-20] 20  (10/20 0522) BP: (110-118)/(75-81) 110/75 mmHg (10/20 0522) SpO2:  [94 %-97 %] 97 % (10/20 0522) Weight change:   Intake/Output from previous day: 10/19 0701 - 10/20 0700 In: 1082 [P.O.:1080; IV Piggyback:2] Out: 1650 [Urine:1650] Intake/Output this shift:    General appearance: alert Chest wall: no tenderness Cardio: regular rate and rhythm, S1, S2 normal, no murmur, click, rub or gallop GI: soft, non-tender; bowel sounds normal; no masses,  no organomegaly Extremities: extremities normal, atraumatic, no cyanosis or edema  Lab Results:  [facility] 03/19/11 0448  WBC 11.4*  HGB 14.5  HCT 38.9*  PLT 232   BMET:  Basename 03/21/11 0605 03/20/11 0513  NA 126* 117*  K 3.6 3.8  CL 90* 82*  CO2 26 23  GLUCOSE 147* 133*  BUN 17 17  CREATININE 0.92 0.85  CALCIUM 9.4 9.2   No results found for this basename: PTH:2 in the last 72 hours Iron Studies: No results found for this basename: IRON,TIBC,TRANSFERRIN,FERRITIN in the last 72 hours  Studies/Results: No results found.  I have reviewed the patient's current medications.  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 His sodium has improved 126 patient seems to be feeling better. Problem #2 history of hypertension blood pressure seems to be controlled reasonably good. Problem #3 history of her Crohn's disease Problem #4 history of nausea vomiting and the diarrhea has a seems to improve. Problem #5 history of anemia his hemoglobin and hematocrit is stable. Problem #6 history of GERD Her recommendation he her we'll  continue with the  Demeclocline  We'll DC the sodium tablets [place] patient in the 3-4 weeks once his discharged.    LOS: 6 days   [name] S 03/21/2011,12:46 PM

## 2011-03-21 NOTE — Progress Notes (Signed)
Patient received discharge instructions along with follow up appointments and prescriptions. Patient verbalized understanding of all instructions. Patient was escorted by staff to vehicle. Patient discharged to home in stable condition. 

## 2011-06-09 ENCOUNTER — Other Ambulatory Visit: Payer: Self-pay

## 2011-06-09 MED ORDER — CHOLESTYRAMINE 4 GM/DOSE PO POWD: 4.0000 g | Freq: Every day | ORAL | Status: DC

## 2011-06-09 MED ORDER — ESOMEPRAZOLE MAGNESIUM 40 MG PO CPDR: 40.0000 mg | DELAYED_RELEASE_CAPSULE | Freq: Two times a day (BID) | ORAL | Status: DC

## 2011-06-09 NOTE — Telephone Encounter (Signed)
Addended by: Nira Retort on: 06/09/2011 02:57 PM   Modules accepted: Orders

## 2011-06-09 NOTE — Telephone Encounter (Signed)
Completed.

## 2011-06-09 NOTE — Telephone Encounter (Signed)
Pt needs rx's written for 3 month supply and printed to [facility].

## 2011-06-09 NOTE — Telephone Encounter (Signed)
Routed back to AS

## 2011-06-09 NOTE — Telephone Encounter (Signed)
rxs faxed

## 2011-06-18 ENCOUNTER — Telehealth: Payer: Self-pay

## 2011-06-18 NOTE — Telephone Encounter (Signed)
Pt's wife called and wants a return call from Dr. Luvenia Starch nurse in reference to ordering B12 injection. She can be reached at [phone].

## 2011-06-22 NOTE — Telephone Encounter (Signed)
Spoke with pts wife- she said [facility] prescribed B- 12 injections and they need a refill. He takes 1000mg - 1ml q month. Pt also needs rx for needles sent in.   Darl Pikes- she said he is to come back for ov in February. She is requesting appt on a Monday.

## 2011-06-23 ENCOUNTER — Encounter: Payer: Self-pay | Admitting: Internal Medicine

## 2011-06-23 NOTE — Telephone Encounter (Signed)
Pt is aware of OV with [facility] ONLY on 07/10/11 at 0800 and appt card was mailed

## 2011-06-23 NOTE — Telephone Encounter (Signed)
This was last filled on 08/22/10 by Doristine Church

## 2011-06-24 MED ORDER — CYANOCOBALAMIN 1000 MCG/ML IJ SOLN: 1000.0000 ug | INTRAMUSCULAR | Status: DC

## 2011-06-24 NOTE — Telephone Encounter (Signed)
Addended by: Nira Retort on: 06/24/2011 01:08 PM   Modules accepted: Orders

## 2011-06-24 NOTE — Telephone Encounter (Signed)
Sent rx for B12 with 5 refills.

## 2011-07-10 ENCOUNTER — Ambulatory Visit (INDEPENDENT_AMBULATORY_CARE_PROVIDER_SITE_OTHER): Payer: Medicare Other | Admitting: Internal Medicine

## 2011-07-10 ENCOUNTER — Encounter: Payer: Self-pay | Admitting: Internal Medicine

## 2011-07-10 DIAGNOSIS — K5090 Crohn's disease, unspecified, without complications: Secondary | ICD-10-CM

## 2011-07-10 DIAGNOSIS — K219 Gastro-esophageal reflux disease without esophagitis: Secondary | ICD-10-CM

## 2011-07-10 NOTE — Patient Instructions (Addendum)
Continue Questran and Entocort (benefits outweigh the risks)  Continue B12 injections  Office visit in 6 months   Continue Nexium

## 2011-07-10 NOTE — Assessment & Plan Note (Addendum)
Clinically, in remission. He is on Entocort 6 mg daily. We tried to dropping to 3 mg daily but had a flare in symptoms. Discussed the long-term risks and benefits alternatives today at length. . I feel the benefits outweigh the risks. Bone density studies encouraging. Blood sugars reportedly well controlled. Consider bone density study later this year.  Recommendations: Continue Entocort 6 mg daily. Continue monthly B12 injections.  Blood pressure elevated today. Recommend followup with PCP( Dr. Dorna Leitz)  Continue low-dose Lanetta Inch not to be taken within 2 hours of other medications scheduled.  Continue Nexium for GERD  Office visit in 6 months.
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
## 2011-09-11 ENCOUNTER — Telehealth: Payer: Self-pay

## 2011-09-11 MED ORDER — OMEPRAZOLE 20 MG PO CPDR: 20.0000 mg | DELAYED_RELEASE_CAPSULE | Freq: Two times a day (BID) | ORAL | Status: DC

## 2011-09-11 NOTE — Telephone Encounter (Signed)
pts wife called- nexium bid is costing them 60.00 per 90days. They want to know if he can switch to either pantoprazole bid or omeprazole bid. Either one will only cost them 10.00 per 90 days.  [facility].

## 2011-09-11 NOTE — Telephone Encounter (Signed)
done

## 2011-09-15 NOTE — Telephone Encounter (Signed)
Open in error

## 2012-01-19 ENCOUNTER — Ambulatory Visit (INDEPENDENT_AMBULATORY_CARE_PROVIDER_SITE_OTHER): Payer: BC Managed Care – PPO | Admitting: Internal Medicine

## 2012-01-19 ENCOUNTER — Encounter: Payer: Self-pay | Admitting: Internal Medicine

## 2012-01-19 VITALS — BP 163/97 | HR 71 | Temp 98.0°F | Ht 69.0 in | Wt 188.4 lb

## 2012-01-19 DIAGNOSIS — K5090 Crohn's disease, unspecified, without complications: Secondary | ICD-10-CM

## 2012-01-19 DIAGNOSIS — K219 Gastro-esophageal reflux disease without esophagitis: Secondary | ICD-10-CM
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
## 2012-01-19 NOTE — Patient Instructions (Addendum)
Continue present regimen   Office visit in 6 months  Find out from Dr. Richardson Landry  when he thinks next bone study needed

## 2012-03-29 LAB — COMPREHENSIVE METABOLIC PANEL
Alkaline Phosphatase: 60 U/L
BUN: 8 mg/dL (ref 4–21)
Creat: 0.7
Glucose: 128
Potassium: 4.9 mmol/L
Sodium: 135 mmol/L — AB (ref 137–147)

## 2012-03-29 LAB — CBC: Hemoglobin: 13 g/dL — AB (ref 13.5–17.5)

## 2012-04-04 ENCOUNTER — Other Ambulatory Visit: Payer: Self-pay | Admitting: Gastroenterology

## 2012-04-05 ENCOUNTER — Encounter: Payer: Self-pay | Admitting: Internal Medicine

## 2012-04-06 ENCOUNTER — Ambulatory Visit (INDEPENDENT_AMBULATORY_CARE_PROVIDER_SITE_OTHER): Payer: Medicare Other | Admitting: Gastroenterology

## 2012-04-06 ENCOUNTER — Encounter: Payer: Self-pay | Admitting: Gastroenterology

## 2012-04-06 VITALS — BP 160/100 | HR 70 | Temp 98.4°F | Ht 70.0 in | Wt 193.4 lb

## 2012-04-06 DIAGNOSIS — K5090 Crohn's disease, unspecified, without complications: Secondary | ICD-10-CM

## 2012-04-06 DIAGNOSIS — D649 Anemia, unspecified: Secondary | ICD-10-CM

## 2012-04-06 NOTE — Progress Notes (Signed)
Faxed to PCP

## 2012-04-06 NOTE — Assessment & Plan Note (Signed)
Hgb one year ago was 14.5. Now 13. Stools with reddish tint at times. On chronic B12 injections and oral iron. Clinically crohn's doing ok on entocort/Questran. No change in stools. He has intermittent GERD on nexium BID. Last EGD three years ago. Last TCS 09/2010 with small anastomotic ulcer and active crohn's on bx. Patient resistant to biologics or immunosuppressant therapy given his prior h/o neoplasia.   Discussed with patient. I will discuss further with Dr. Jena Gauss. May need to check iron and ferritin, ifobt, +/- EGD but I don't think colonoscopy is going to add much at this point.

## 2012-04-06 NOTE — Patient Instructions (Signed)
I will discuss your case with Dr. Jena Gauss and let you know what the next step will be for change in your hemoglobin. Please collect stool for blood.
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
## 2012-04-07 ENCOUNTER — Other Ambulatory Visit: Payer: Self-pay

## 2012-04-07 MED ORDER — CHOLESTYRAMINE 4 G PO PACK: 1.0000 | PACK | Freq: Every day | ORAL | Status: DC

## 2012-04-08 ENCOUNTER — Encounter: Payer: Self-pay | Admitting: Gastroenterology

## 2012-04-11 ENCOUNTER — Other Ambulatory Visit: Payer: Self-pay

## 2012-04-11 DIAGNOSIS — D582 Other hemoglobinopathies: Secondary | ICD-10-CM

## 2012-04-11 NOTE — Addendum Note (Signed)
Addended by: Tiffany Kocher on: 04/11/2012 01:20 PM   Modules accepted: Level of Service

## 2012-04-11 NOTE — Progress Notes (Signed)
Please let pt know.  Per Dr. Jena Gauss: 1. He thinks drop in H/H likely due to subclinically active Crohn's. 2. He wants patient to increase Entocort to 9mg  daily.   3. Recheck CBC in 6 weeks.   4. OV with Dr. Jena Gauss 6-8 weeks.  5. Please verify with patient that he is not taking any NSAIDS. 6. Please ask patient when Dr. Richardson Landry plans for his next Bone density study.

## 2012-04-11 NOTE — Progress Notes (Signed)
He is aware to increase Entocort to 9 mg daily. He has a follow up with RMR on January 3 at 8:00. He is going to Dr. Richardson Landry this Friday. He just got out of [facility]. I am going to mail out the labs when it is time, so I will put them in the box.

## 2012-04-12 ENCOUNTER — Other Ambulatory Visit: Payer: Self-pay

## 2012-04-12 ENCOUNTER — Encounter: Payer: Self-pay | Admitting: Internal Medicine

## 2012-04-12 DIAGNOSIS — D582 Other hemoglobinopathies: Secondary | ICD-10-CM

## 2012-04-18 LAB — COMPREHENSIVE METABOLIC PANEL
BUN: 6 mg/dL (ref 4–21)
Creat: 0.8
Potassium: 4.2 mmol/L
Sodium: 136 mmol/L — AB (ref 137–147)

## 2012-04-20 ENCOUNTER — Ambulatory Visit (INDEPENDENT_AMBULATORY_CARE_PROVIDER_SITE_OTHER): Payer: Medicare Other | Admitting: Gastroenterology

## 2012-04-20 DIAGNOSIS — D649 Anemia, unspecified: Secondary | ICD-10-CM

## 2012-04-20 LAB — IFOBT (OCCULT BLOOD): IFOBT: POSITIVE

## 2012-04-22 NOTE — Progress Notes (Signed)
See result note.  

## 2012-04-22 NOTE — Progress Notes (Signed)
Quick Note:  Not surprising in setting of Crohn's and h/o anastomotic ulcer. Plan as previously outlined with increase of entocort, f/u labs, ov with rmr as planned. How is patient doing? What was he in [facility] for recently? If GI related, please obtain records. ______

## 2012-04-26 ENCOUNTER — Other Ambulatory Visit: Payer: Self-pay | Admitting: Gastroenterology

## 2012-04-26 NOTE — Progress Notes (Signed)
Patient ID: [name], male   DOB: [birth year], 56 y.o.   MRN: [id number]   Received labs from PCP collected on 04/18/2012. Sodium 136, potassium 4.2, creatinine 0.8, calcium 9.1. No CBC done.  Please remind the patient that he will still need to have his CBC done around 05/20/2012 to followup on his drop in hemoglobin. This was not done with his recent labs from PCP.  Keep appointment with Dr. Jena Gauss on 06/10/2012.

## 2012-04-26 NOTE — Progress Notes (Signed)
Patient ID: [name], male   DOB: [birth year], 56 y.o.   MRN: [id number] Pt called this morning. I informed him that the IFOBT test was positive and we received his blood work from [place] and when [facility] looks at it we ill call him back with the next step.

## 2012-05-03 NOTE — Progress Notes (Signed)
Lab order has already been mailed to pt.  

## 2012-06-03 ENCOUNTER — Ambulatory Visit: Payer: BC Managed Care – PPO | Admitting: Internal Medicine

## 2012-06-10 ENCOUNTER — Encounter: Payer: Self-pay | Admitting: Internal Medicine

## 2012-06-10 ENCOUNTER — Ambulatory Visit (INDEPENDENT_AMBULATORY_CARE_PROVIDER_SITE_OTHER): Payer: Medicare Other | Admitting: Internal Medicine

## 2012-06-10 VITALS — BP 145/89 | HR 74 | Temp 97.4°F | Ht 70.0 in | Wt 193.8 lb

## 2012-06-10 DIAGNOSIS — K219 Gastro-esophageal reflux disease without esophagitis: Secondary | ICD-10-CM

## 2012-06-10 DIAGNOSIS — K5090 Crohn's disease, unspecified, without complications: Secondary | ICD-10-CM

## 2012-06-10 NOTE — Patient Instructions (Addendum)
Continue Entocort 9 mg daily for another 3 months  Office visit with me in 3 months   Continue Nexium 40 mg orally twice daily.
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
## 2012-07-21 ENCOUNTER — Telehealth: Payer: Self-pay | Admitting: *Deleted

## 2012-07-21 NOTE — Telephone Encounter (Signed)
Mr Defenbaugh called today. His wife unexpectedly lost her job, therefore his insurance will be changing very soon. He is currently taking nexiums, however once he goes on  His medicare plan, they will be very expensive.  He wants to know if he can have something that his insurance will cover or if we can get prior authorization for his medication that his insurance will cover the nexiums for him.  He seemed to be very concerned and wants to speak with you about this asap. Thank you.

## 2012-07-21 NOTE — Telephone Encounter (Signed)
Tried to call pt-NA and his voice mailbox has not been set up yet.

## 2012-07-21 NOTE — Telephone Encounter (Signed)
Spoke with pt- he is changing to a medicare plan (which he has not gotten yet) and is going to need a generic. nexium is not covered and will cost him $500 a month. He has enough nexium for now but will be needing a new rx for a generic March 1st when his current insurance runs out. He stated he will be using mail order. Advised him to call me back when he knows which plan his is going to have and I will find out what we need to do. Pt verbalized understanding.

## 2012-07-22 NOTE — Telephone Encounter (Signed)
agree

## 2012-08-03 ENCOUNTER — Other Ambulatory Visit: Payer: Self-pay

## 2012-08-03 MED ORDER — ESOMEPRAZOLE MAGNESIUM 40 MG PO CPDR: 40.0000 mg | DELAYED_RELEASE_CAPSULE | Freq: Two times a day (BID) | ORAL | Status: DC

## 2012-08-03 MED ORDER — CHOLESTYRAMINE 4 G PO PACK: 1.0000 | PACK | Freq: Every day | ORAL | Status: DC

## 2012-08-08 ENCOUNTER — Other Ambulatory Visit: Payer: Self-pay

## 2012-08-08 MED ORDER — BUDESONIDE 3 MG PO CP24: 9.0000 mg | ORAL_CAPSULE | Freq: Every day | ORAL | Status: DC

## 2012-08-24 ENCOUNTER — Other Ambulatory Visit: Payer: Self-pay

## 2012-08-24 MED ORDER — SYRINGE (DISPOSABLE) 3 ML MISC: Status: DC

## 2012-08-24 MED ORDER — CYANOCOBALAMIN 1000 MCG/ML IJ SOLN: 1000.0000 ug | INTRAMUSCULAR | Status: DC

## 2012-08-25 ENCOUNTER — Telehealth: Payer: Self-pay | Admitting: Internal Medicine

## 2012-08-25 NOTE — Telephone Encounter (Signed)
Pt called to say he is no longer on his wife's insurance which was his primary. He is now on Medicare part D and said that we needed to call [phone] because they need a list of his medicines for them to cover it. Any questions you can reach [name] at [phone]

## 2012-08-26 NOTE — Telephone Encounter (Signed)
Working on [place] for this pt.

## 2012-08-29 ENCOUNTER — Telehealth: Payer: Self-pay

## 2012-08-29 MED ORDER — CYANOCOBALAMIN 1000 MCG/ML IJ SOLN: 1000.0000 ug | INTRAMUSCULAR | Status: DC

## 2012-08-29 MED ORDER — SYRINGE (DISPOSABLE) 3 ML MISC: Status: DC

## 2012-08-29 NOTE — Telephone Encounter (Signed)
done

## 2012-08-29 NOTE — Telephone Encounter (Signed)
We did a refill for his BD SYR and B12 INJ but it went to the wrong mail order. Can we please send it to [facility].

## 2012-09-06 ENCOUNTER — Ambulatory Visit (INDEPENDENT_AMBULATORY_CARE_PROVIDER_SITE_OTHER): Payer: Medicare Other | Admitting: Internal Medicine

## 2012-09-06 ENCOUNTER — Encounter: Payer: Self-pay | Admitting: Internal Medicine

## 2012-09-06 VITALS — BP 140/87 | HR 75 | Temp 97.5°F | Ht 70.0 in | Wt 197.6 lb

## 2012-09-06 DIAGNOSIS — K5080 Crohn's disease of both small and large intestine without complications: Secondary | ICD-10-CM

## 2012-09-06 DIAGNOSIS — K219 Gastro-esophageal reflux disease without esophagitis: Secondary | ICD-10-CM

## 2012-09-06 DIAGNOSIS — E538 Deficiency of other specified B group vitamins: Secondary | ICD-10-CM

## 2012-09-06 DIAGNOSIS — K50813 Crohn's disease of both small and large intestine with fistula: Secondary | ICD-10-CM
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
## 2012-09-06 NOTE — Patient Instructions (Signed)
Continue Entocort 9 mg daily  Continue nexium 40 mg twice daily  Office visit in 6 month

## 2012-10-04 ENCOUNTER — Other Ambulatory Visit: Payer: Self-pay

## 2012-10-05 MED ORDER — BUDESONIDE 3 MG PO CP24: 9.0000 mg | ORAL_CAPSULE | Freq: Every day | ORAL | Status: DC

## 2013-01-04 ENCOUNTER — Encounter (HOSPITAL_COMMUNITY): Payer: Self-pay

## 2013-01-04 ENCOUNTER — Inpatient Hospital Stay (HOSPITAL_COMMUNITY)
Admission: EM | Admit: 2013-01-04 | Discharge: 2013-01-08 | DRG: 644 | Disposition: A | Discharge status: Home / Self Care | Payer: Medicare Other | Attending: Internal Medicine | Admitting: Internal Medicine

## 2013-01-04 ENCOUNTER — Emergency Department (HOSPITAL_COMMUNITY): Payer: Medicare Other

## 2013-01-04 DIAGNOSIS — K5090 Crohn's disease, unspecified, without complications: Secondary | ICD-10-CM | POA: Diagnosis present

## 2013-01-04 DIAGNOSIS — E669 Obesity, unspecified: Secondary | ICD-10-CM | POA: Diagnosis present

## 2013-01-04 DIAGNOSIS — K50919 Crohn's disease, unspecified, with unspecified complications: Secondary | ICD-10-CM

## 2013-01-04 DIAGNOSIS — D72829 Elevated white blood cell count, unspecified: Secondary | ICD-10-CM | POA: Diagnosis present

## 2013-01-04 DIAGNOSIS — E876 Hypokalemia: Secondary | ICD-10-CM | POA: Diagnosis present

## 2013-01-04 DIAGNOSIS — IMO0001 Reserved for inherently not codable concepts without codable children: Secondary | ICD-10-CM | POA: Diagnosis present

## 2013-01-04 DIAGNOSIS — Z6827 Body mass index (BMI) 27.0-27.9, adult: Secondary | ICD-10-CM

## 2013-01-04 DIAGNOSIS — E871 Hypo-osmolality and hyponatremia: Secondary | ICD-10-CM

## 2013-01-04 DIAGNOSIS — T40605A Adverse effect of unspecified narcotics, initial encounter: Secondary | ICD-10-CM | POA: Diagnosis present

## 2013-01-04 DIAGNOSIS — R5383 Other fatigue: Secondary | ICD-10-CM

## 2013-01-04 DIAGNOSIS — R531 Weakness: Secondary | ICD-10-CM | POA: Diagnosis present

## 2013-01-04 DIAGNOSIS — Z85528 Personal history of other malignant neoplasm of kidney: Secondary | ICD-10-CM

## 2013-01-04 DIAGNOSIS — E878 Other disorders of electrolyte and fluid balance, not elsewhere classified: Secondary | ICD-10-CM | POA: Diagnosis present

## 2013-01-04 DIAGNOSIS — R52 Pain, unspecified: Secondary | ICD-10-CM | POA: Diagnosis present

## 2013-01-04 DIAGNOSIS — E119 Type 2 diabetes mellitus without complications: Secondary | ICD-10-CM | POA: Diagnosis present

## 2013-01-04 DIAGNOSIS — I10 Essential (primary) hypertension: Secondary | ICD-10-CM | POA: Diagnosis present

## 2013-01-04 DIAGNOSIS — R700 Elevated erythrocyte sedimentation rate: Secondary | ICD-10-CM | POA: Diagnosis present

## 2013-01-04 DIAGNOSIS — B399 Histoplasmosis, unspecified: Secondary | ICD-10-CM | POA: Diagnosis present

## 2013-01-04 DIAGNOSIS — Z79899 Other long term (current) drug therapy: Secondary | ICD-10-CM

## 2013-01-04 DIAGNOSIS — R5381 Other malaise: Secondary | ICD-10-CM

## 2013-01-04 DIAGNOSIS — D649 Anemia, unspecified: Secondary | ICD-10-CM | POA: Diagnosis present

## 2013-01-04 DIAGNOSIS — M810 Age-related osteoporosis without current pathological fracture: Secondary | ICD-10-CM | POA: Diagnosis present

## 2013-01-04 DIAGNOSIS — K219 Gastro-esophageal reflux disease without esophagitis: Secondary | ICD-10-CM | POA: Diagnosis present

## 2013-01-04 DIAGNOSIS — E236 Other disorders of pituitary gland: Principal | ICD-10-CM | POA: Diagnosis present

## 2013-01-04 DIAGNOSIS — F411 Generalized anxiety disorder: Secondary | ICD-10-CM | POA: Diagnosis present

## 2013-01-04 LAB — URINALYSIS, ROUTINE W REFLEX MICROSCOPIC
Glucose, UA: 250 mg/dL — AB
Hgb urine dipstick: NEGATIVE
Specific Gravity, Urine: 1.02 (ref 1.005–1.030)
pH: 7.5 (ref 5.0–8.0)

## 2013-01-04 LAB — COMPREHENSIVE METABOLIC PANEL
ALT: 19 U/L (ref 0–53)
AST: 15 U/L (ref 0–37)
Albumin: 3.8 g/dL (ref 3.5–5.2)
Alkaline Phosphatase: 44 U/L (ref 39–117)
BUN: 12 mg/dL (ref 6–23)
Chloride: 77 mEq/L — ABNORMAL LOW (ref 96–112)
Potassium: 3.2 mEq/L — ABNORMAL LOW (ref 3.5–5.1)
Sodium: 120 mEq/L — ABNORMAL LOW (ref 135–145)
Total Bilirubin: 1.3 mg/dL — ABNORMAL HIGH (ref 0.3–1.2)

## 2013-01-04 LAB — GLUCOSE, CAPILLARY
Glucose-Capillary: 168 mg/dL — ABNORMAL HIGH (ref 70–99)
Glucose-Capillary: 217 mg/dL — ABNORMAL HIGH (ref 70–99)
Glucose-Capillary: 212 mg/dL — ABNORMAL HIGH (ref 70–99)

## 2013-01-04 LAB — CBC WITH DIFFERENTIAL/PLATELET
Basophils Relative: 0 % (ref 0–1)
Hemoglobin: 14 g/dL (ref 13.0–17.0)
Lymphs Abs: 1.5 10*3/uL (ref 0.7–4.0)
MCHC: 36.4 g/dL — ABNORMAL HIGH (ref 30.0–36.0)
Monocytes Relative: 10 % (ref 3–12)
Neutro Abs: 11 10*3/uL — ABNORMAL HIGH (ref 1.7–7.7)
Neutrophils Relative %: 78 % — ABNORMAL HIGH (ref 43–77)
Platelets: 265 10*3/uL (ref 150–400)
RBC: 4.69 MIL/uL (ref 4.22–5.81)

## 2013-01-04 LAB — URINE MICROSCOPIC-ADD ON

## 2013-01-04 LAB — TROPONIN I: Troponin I: <0.3 ng/mL (ref <0.30)

## 2013-01-04 LAB — BASIC METABOLIC PANEL
CO2: 20 mEq/L (ref 19–32)
Chloride: 81 mEq/L — ABNORMAL LOW (ref 96–112)
Creatinine, Ser: 0.79 mg/dL (ref 0.50–1.35)
GFR calc Af Amer: >90 mL/min (ref >90)
Potassium: 3.4 mEq/L — ABNORMAL LOW (ref 3.5–5.1)
Sodium: 117 mEq/L — CL (ref 135–145)

## 2013-01-04 LAB — LACTIC ACID, PLASMA: Lactic Acid, Venous: 1.9 mmol/L (ref 0.5–2.2)

## 2013-01-04 LAB — MRSA PCR SCREENING: MRSA by PCR: NEGATIVE

## 2013-01-04 LAB — CK TOTAL AND CKMB (NOT AT ARMC)
CK, MB: 2.3 ng/mL (ref 0.3–4.0)
Relative Index: 1.9 (ref 0.0–2.5)
Total CK: 124 U/L (ref 7–232)

## 2013-01-04 LAB — SEDIMENTATION RATE: Sed Rate: 95 mm/hr — ABNORMAL HIGH (ref 0–16)

## 2013-01-04 LAB — PRO B NATRIURETIC PEPTIDE: Pro B Natriuretic peptide (BNP): 108.2 pg/mL (ref 0–125)

## 2013-01-04 MED ORDER — ONDANSETRON HCL 4 MG PO TABS: 4.0000 mg | ORAL_TABLET | Freq: Four times a day (QID) | ORAL | Status: DC | PRN

## 2013-01-04 MED ORDER — ALPRAZOLAM 0.25 MG PO TABS
0.2500 mg | ORAL_TABLET | Freq: Three times a day (TID) | ORAL | Status: DC | PRN
  Administered 2013-01-04 – 2013-01-07 (×7): 0.25 mg via ORAL
  Filled 2013-01-04 (×7): qty 1

## 2013-01-04 MED ORDER — POTASSIUM CHLORIDE CRYS ER 20 MEQ PO TBCR
40.0000 meq | EXTENDED_RELEASE_TABLET | Freq: Once | ORAL | Status: AC
  Administered 2013-01-04: 40 meq via ORAL
  Filled 2013-01-04: qty 2

## 2013-01-04 MED ORDER — LISINOPRIL 10 MG PO TABS
40.0000 mg | ORAL_TABLET | Freq: Every day | ORAL | Status: DC
  Administered 2013-01-04: 40 mg via ORAL
  Filled 2013-01-04: qty 4

## 2013-01-04 MED ORDER — ATENOLOL 25 MG PO TABS
25.0000 mg | ORAL_TABLET | Freq: Two times a day (BID) | ORAL | Status: DC
  Administered 2013-01-04 – 2013-01-07 (×8): 25 mg via ORAL
  Filled 2013-01-04 (×8): qty 1

## 2013-01-04 MED ORDER — TRAZODONE HCL 50 MG PO TABS
50.0000 mg | ORAL_TABLET | Freq: Every evening | ORAL | Status: DC | PRN
  Administered 2013-01-04 – 2013-01-06 (×3): 50 mg via ORAL
  Filled 2013-01-04 (×3): qty 1

## 2013-01-04 MED ORDER — LORATADINE 10 MG PO TABS
10.0000 mg | ORAL_TABLET | Freq: Every day | ORAL | Status: DC
  Administered 2013-01-04 – 2013-01-07 (×4): 10 mg via ORAL
  Filled 2013-01-04 (×4): qty 1

## 2013-01-04 MED ORDER — CYCLOBENZAPRINE HCL 10 MG PO TABS
10.0000 mg | ORAL_TABLET | Freq: Three times a day (TID) | ORAL | Status: DC | PRN
  Administered 2013-01-04 – 2013-01-05 (×4): 10 mg via ORAL
  Filled 2013-01-04 (×4): qty 1

## 2013-01-04 MED ORDER — PANTOPRAZOLE SODIUM 40 MG PO TBEC
40.0000 mg | DELAYED_RELEASE_TABLET | Freq: Every day | ORAL | Status: DC
  Administered 2013-01-04 – 2013-01-07 (×4): 40 mg via ORAL
  Filled 2013-01-04 (×4): qty 1

## 2013-01-04 MED ORDER — DEMECLOCYCLINE HCL 150 MG PO TABS
600.0000 mg | ORAL_TABLET | Freq: Every day | ORAL | Status: DC
  Administered 2013-01-05: 600 mg via ORAL
  Filled 2013-01-04 (×3): qty 4

## 2013-01-04 MED ORDER — SODIUM CHLORIDE 0.9 % IV SOLN
INTRAVENOUS | Status: DC
  Administered 2013-01-05: 20 mL/h via INTRAVENOUS

## 2013-01-04 MED ORDER — INSULIN ASPART 100 UNIT/ML ~~LOC~~ SOLN
0.0000 [IU] | Freq: Three times a day (TID) | SUBCUTANEOUS | Status: DC
  Administered 2013-01-04: 2 [IU] via SUBCUTANEOUS
  Administered 2013-01-04: 3 [IU] via SUBCUTANEOUS
  Administered 2013-01-05: 2 [IU] via SUBCUTANEOUS

## 2013-01-04 MED ORDER — BUDESONIDE 3 MG PO CP24
9.0000 mg | ORAL_CAPSULE | Freq: Every day | ORAL | Status: DC
  Administered 2013-01-04 – 2013-01-07 (×4): 9 mg via ORAL
  Filled 2013-01-04 (×6): qty 3

## 2013-01-04 MED ORDER — LEVOCETIRIZINE DIHYDROCHLORIDE 5 MG PO TABS: 5.0000 mg | ORAL_TABLET | Freq: Every evening | ORAL | Status: DC

## 2013-01-04 MED ORDER — CHOLESTYRAMINE 4 G PO PACK
1.0000 | PACK | ORAL | Status: DC
  Administered 2013-01-04 – 2013-01-07 (×4): 1 via ORAL
  Filled 2013-01-04 (×6): qty 1

## 2013-01-04 MED ORDER — SODIUM CHLORIDE 0.9 % IJ SOLN
3.0000 mL | Freq: Two times a day (BID) | INTRAMUSCULAR | Status: DC
  Administered 2013-01-04 – 2013-01-07 (×5): 3 mL via INTRAVENOUS

## 2013-01-04 MED ORDER — ACETAMINOPHEN 500 MG PO TABS
500.0000 mg | ORAL_TABLET | Freq: Four times a day (QID) | ORAL | Status: DC | PRN
  Administered 2013-01-04 – 2013-01-06 (×6): 500 mg via ORAL
  Filled 2013-01-04: qty 2
  Filled 2013-01-04 (×6): qty 1

## 2013-01-04 MED ORDER — SODIUM CHLORIDE 0.9 % IV SOLN
INTRAVENOUS | Status: DC
  Administered 2013-01-04: 10:00:00 via INTRAVENOUS

## 2013-01-04 MED ORDER — ENOXAPARIN SODIUM 40 MG/0.4ML ~~LOC~~ SOLN
40.0000 mg | SUBCUTANEOUS | Status: DC
  Administered 2013-01-04 – 2013-01-07 (×4): 40 mg via SUBCUTANEOUS
  Filled 2013-01-04 (×4): qty 0.4

## 2013-01-04 MED ORDER — AMLODIPINE BESYLATE 5 MG PO TABS
5.0000 mg | ORAL_TABLET | Freq: Every day | ORAL | Status: DC
  Administered 2013-01-04 – 2013-01-07 (×4): 5 mg via ORAL
  Filled 2013-01-04 (×4): qty 1

## 2013-01-04 MED ORDER — ONDANSETRON HCL 4 MG/2ML IJ SOLN
4.0000 mg | Freq: Four times a day (QID) | INTRAMUSCULAR | Status: DC | PRN
  Administered 2013-01-06: 4 mg via INTRAVENOUS
  Filled 2013-01-04: qty 2

## 2013-01-04 MED ORDER — SODIUM CHLORIDE 0.9 % IV SOLN: INTRAVENOUS | Status: DC

## 2013-01-04 MED ORDER — FERROUS SULFATE 325 (65 FE) MG PO TABS
325.0000 mg | ORAL_TABLET | Freq: Two times a day (BID) | ORAL | Status: DC
  Administered 2013-01-04 – 2013-01-07 (×7): 325 mg via ORAL
  Filled 2013-01-04 (×7): qty 1

## 2013-01-04 MED ORDER — HYDROMORPHONE HCL PF 1 MG/ML IJ SOLN
0.5000 mg | INTRAMUSCULAR | Status: DC | PRN
  Administered 2013-01-05 (×6): 0.5 mg via INTRAVENOUS
  Filled 2013-01-04 (×6): qty 1

## 2013-01-04 MED ORDER — ALPRAZOLAM 0.25 MG PO TABS
0.2500 mg | ORAL_TABLET | Freq: Two times a day (BID) | ORAL | Status: DC | PRN
  Administered 2013-01-04: 0.25 mg via ORAL
  Filled 2013-01-04: qty 1

## 2013-01-04 NOTE — ED Notes (Signed)
Pt reports started having pain in left hand last Thursday.  Pain progressed up left arm, then started "cramping and hurting all over."  [facility] reports [facility]  [facility]. Pt  had blood work done yesterday and was told his wbc and na was down.  Pt was put on demeclocycline and hydrocodone.  Pt still no better, c/o severe weakness, aching all over.  C/O pain in left hand.

## 2013-01-04 NOTE — Progress Notes (Signed)
01/04/13 1321 Patient c/o feeling anxious, stated "i'm real anxious". Pt requested "my flexeril 10 mg usually two times a day and trazadone at night for sleep". Wife stated "if he doesn't get his trazadone he gets shaky". Pt also requested something for "my anxiety". Emotional support provided. Text-paged Toya Smothers, NP to notify of patient requests. Earnstine Regal, RN
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
## 2013-01-04 NOTE — Progress Notes (Signed)
Per lab sodium level 117 Dr. Rito Ehrlich notified, no new orders, patient was placed on 1000 cc fluid restriction per day prior to blood draw. Patient and patient's wife made aware.
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
## 2013-01-05 ENCOUNTER — Inpatient Hospital Stay (HOSPITAL_COMMUNITY): Payer: Medicare Other

## 2013-01-05 DIAGNOSIS — R52 Pain, unspecified: Secondary | ICD-10-CM

## 2013-01-05 DIAGNOSIS — E119 Type 2 diabetes mellitus without complications: Secondary | ICD-10-CM | POA: Diagnosis present

## 2013-01-05 DIAGNOSIS — D649 Anemia, unspecified: Secondary | ICD-10-CM

## 2013-01-05 LAB — COMPREHENSIVE METABOLIC PANEL
Albumin: 3.1 g/dL — ABNORMAL LOW (ref 3.5–5.2)
BUN: 9 mg/dL (ref 6–23)
Chloride: 86 mEq/L — ABNORMAL LOW (ref 96–112)
Creatinine, Ser: 0.71 mg/dL (ref 0.50–1.35)
GFR calc Af Amer: >90 mL/min (ref >90)
Glucose, Bld: 191 mg/dL — ABNORMAL HIGH (ref 70–99)
Total Bilirubin: 1 mg/dL (ref 0.3–1.2)
Total Protein: 7.2 g/dL (ref 6.0–8.3)

## 2013-01-05 LAB — CBC
MCV: 81.4 fL (ref 78.0–100.0)
Platelets: 271 10*3/uL (ref 150–400)
RBC: 4.29 MIL/uL (ref 4.22–5.81)
WBC: 13.5 10*3/uL — ABNORMAL HIGH (ref 4.0–10.5)

## 2013-01-05 LAB — GLUCOSE, CAPILLARY: Glucose-Capillary: 184 mg/dL — ABNORMAL HIGH (ref 70–99)

## 2013-01-05 LAB — HEMOGLOBIN A1C: Mean Plasma Glucose: 177 mg/dL — ABNORMAL HIGH (ref <117)

## 2013-01-05 MED ORDER — INSULIN ASPART 100 UNIT/ML ~~LOC~~ SOLN
0.0000 [IU] | Freq: Three times a day (TID) | SUBCUTANEOUS | Status: DC
  Administered 2013-01-05: 3 [IU] via SUBCUTANEOUS
  Administered 2013-01-05: 5 [IU] via SUBCUTANEOUS

## 2013-01-05 MED ORDER — IOHEXOL 300 MG/ML  SOLN
50.0000 mL | Freq: Once | INTRAMUSCULAR | Status: AC | PRN
  Administered 2013-01-05: 50 mL via ORAL

## 2013-01-05 MED ORDER — INSULIN ASPART 100 UNIT/ML ~~LOC~~ SOLN
0.0000 [IU] | Freq: Every day | SUBCUTANEOUS | Status: DC
  Administered 2013-01-05: 3 [IU] via SUBCUTANEOUS
  Administered 2013-01-06: 2 [IU] via SUBCUTANEOUS

## 2013-01-05 MED ORDER — IOHEXOL 300 MG/ML  SOLN
100.0000 mL | Freq: Once | INTRAMUSCULAR | Status: AC | PRN
  Administered 2013-01-05: 100 mL via INTRAVENOUS

## 2013-01-05 NOTE — Progress Notes (Signed)
Patient independently returned back to bed, c/o pain and muscle spasm in bilateral legs, patient medicated per order.

## 2013-01-05 NOTE — Progress Notes (Signed)
01/05/13 1520 Late entry for 1415. Text-paged Dr. Karilyn Cota to notify of CT results back for review. Earnstine Regal, RN

## 2013-01-05 NOTE — Progress Notes (Signed)
Inpatient Diabetes Program Recommendations  AACE/ADA: New Consensus Statement on Inpatient Glycemic Control (2013)  Target Ranges:  Prepandial:   less than 140 mg/dL      Peak postprandial:   less than 180 mg/dL (1-2 hours)      Critically ill patients:  140 - 180 mg/dL  Results for [name], [name] (MRN [id number]) as of 01/05/2013 08:41  Ref. Range 01/04/2013 09:12 01/04/2013 20:14 01/05/2013 04:57  Hemoglobin A1C Latest Range: <5.7 % 7.8 (H)    Glucose Latest Range: 70-99 mg/dL 846 (H) 962 (H) 952 (H)    Results for [name], [name] (MRN [id number]) as of 01/05/2013 08:41  Ref. Range 01/04/2013 11:46 01/04/2013 16:24 01/04/2013 21:57 01/05/2013 07:32  Glucose-Capillary Latest Range: 70-99 mg/dL 027 (H) 253 (H) 664 (H) 184 (H)    Inpatient Diabetes Program Recommendations Correction (SSI): Please consider increasing Novolog correction to moderate scale and add Novolog bedtime correction.  Note: Patient has a history of diabetes and takes Glipizide 10mg  BID and Metformin 1000 mg BID at home for diabetes management.  Currently, patient is ordered to receive Novolog 0-9 AC for inpatient glycemic control.  Bedtime glucose last night was 212 mg/dl and fasting glucose this morning was 184 mg/dl.  Please consider increasing Novolog correction to moderate scale and add bedtime correction.  Will continue to follow.  Thanks, Orlando Penner, RN, MSN, CCRN Diabetes Coordinator Inpatient Diabetes Program [phone]

## 2013-01-05 NOTE — Progress Notes (Signed)
Patient up walking around in room independently asking if he can walk around the unit, patient was informed because of all the  Medical equipment attached to him now is not a good time, patient sat in recliner chair, urinal and call bell within reach.

## 2013-01-05 NOTE — Progress Notes (Addendum)
Patient sitting on side of bed, wife assisting with ADL's, patient complain of pain in bilateral knees, medicated as ordered, patient verbalized he is  unable to stand to transfer to recliner and also unable to hold his cup in hand, while 2 staff members were at bedside attempting to assist with transfer, patient stood up independently grabbed hold of bedside table and transferred himself to recliner stating loudly you people don't know what you're doing. Call bell and urinal within arms reach, wife remains at bedside.

## 2013-01-05 NOTE — Progress Notes (Signed)
I SPOKE WITH CINDY RN ABOUT PATIENT NOT TAKING METFORMIN MEDS FOR THE NEXT 48 HOURS. SHE UNDERSTOOD.

## 2013-01-05 NOTE — Progress Notes (Signed)
[facility] PROGRESS NOTE  [name] ZOX:[id number] DOB: 1956-01-14 DOA: 01/04/2013 PCP: Zachery Dauer, MD  Assessment/Plan: Generalized pain: Etiology unclear: Total CK  within limits of normal. Sedimentation rate elevated at 95. Uric acid low. Not much better this am. Complains multiple joint pains. Concern for underlying malignancy. Will check cancer antigen, CT of pelvis, abdomen and chest. Continue pain medicine as needed.   Hyponatremia: Trending down today. History of chronic hyponatremia. This may be an exacerbation do to viral influence. serum osmolality 257 urine osmolality 831 and urine sodium 113. Will  continue Declomycin.  Fluid restriction of 1000/day. Monitor  Leukocytosis, unspecified: Etiology unclear. Chest x-ray and urinalysis unremarkable. Trending downward slightly.  Afebrile. Supportive therapy in the form of IV fluids. Hemodynamically stable   Hypokalemia: repleted and resolved. Will monitor  Diabetes: uncontrolled. HgA1c 7.8. Takes metformin at home. Holding for now. CBG range 212-184. Will increase SSI to moderate and add bedtime coverage. Monitor.    Active Problems:  CROHN'S DISEASE: Reports currently at baseline. Will continue home meds   GERD (gastroesophageal reflux disease): Stable at baseline. Will continue home in  Chronic hyponatremia: See #2.   Code Status: full Family Communication: wife at bedside Disposition Plan: home when ready likely day or 2.    Consultants:  none  Procedures:  none  Antibiotics: none HPI/Subjective: Sitting up in bed eating breakfast. Continues with multiple joint pain. Denies abdominal pain/nausea.  Objective: Filed Vitals:   01/05/13 0700  BP: 141/83  Pulse: 90  Temp:   Resp:     Intake/Output Summary (Last 24 hours) at 01/05/13 0836 Last data filed at 01/05/13 0700  Gross per 24 hour  Intake 702.08 ml  Output    800 ml  Net -97.92 ml   Filed Weights   01/04/13 0903 01/04/13 1144 01/05/13  0500  Weight: 195 lb (88.451 kg) 162 lb 14.7 oz (73.9 kg) 186 lb 1.1 oz (84.4 kg)    Exam:   General:  Obese NAD  Cardiovascular: RRR No MGR no LE edema  Respiratory: normal effort BS clear bilaterally   Abdomen: obese soft +BS non-tender to palpation  Musculoskeletal: decreased ROM to bilateral shoulders due to pain. Physical exam fairly painful. No joint swelling or warmth   Data Reviewed: Basic Metabolic Panel:  Recent Labs Lab 01/04/13 0912 01/04/13 2014 01/05/13 0457  NA 120* 117* 118*  K 3.2* 3.4* 3.5  CL 77* 81* 86*  CO2 25 20 19   GLUCOSE 203* 238* 191*  BUN 12 10 9   CREATININE 0.93 0.79 0.71  CALCIUM 9.5 8.9 8.9   Liver Function Tests:  Recent Labs Lab 01/04/13 0912 01/05/13 0457  AST 15 14  ALT 19 16  ALKPHOS 44 40  BILITOT 1.3* 1.0  PROT 8.3 7.2  ALBUMIN 3.8 3.1*   No results found for this basename: LIPASE, AMYLASE,  in the last 168 hours No results found for this basename: AMMONIA,  in the last 168 hours CBC:  Recent Labs Lab 01/04/13 0912 01/05/13 0457  WBC 14.0* 13.5*  NEUTROABS 11.0*  --   HGB 14.0 12.8*  HCT 38.5* 34.9*  MCV 82.1 81.4  PLT 265 271   Cardiac Enzymes:  Recent Labs Lab 01/04/13 0912 01/04/13 1559  CKTOTAL 124  --   CKMB 2.3  --   TROPONINI <0.30 <0.30   BNP (last 3 results)  Recent Labs  01/04/13 0912  PROBNP 108.2   CBG:  Recent Labs Lab 01/04/13 1146 01/04/13 1624 01/04/13 2157 01/05/13  0732  GLUCAP 168* 217* 212* 184*    Recent Results (from the past 240 hour(s))  CULTURE, BLOOD (ROUTINE X 2)     Status: None   Collection Time    01/04/13  9:12 AM      Result Value Range Status   Specimen Description Blood   Final   Special Requests NONE   Final   Culture NO GROWTH <24 HRS   Final   Report Status PENDING   Incomplete  CULTURE, BLOOD (ROUTINE X 2)     Status: None   Collection Time    01/04/13  9:38 AM      Result Value Range Status   Specimen Description Blood   Final   Special  Requests NONE   Final   Culture NO GROWTH <24 HRS   Final   Report Status PENDING   Incomplete  MRSA PCR SCREENING     Status: None   Collection Time    01/04/13 11:23 AM      Result Value Range Status   MRSA by PCR NEGATIVE  NEGATIVE Final   Comment:            The GeneXpert MRSA Assay (FDA     approved for NASAL specimens     only), is one component of a     comprehensive MRSA colonization     surveillance program. It is not     intended to diagnose MRSA     infection nor to guide or     monitor treatment for     MRSA infections.     Studies: Dg Chest Port 1 View  01/04/2013   *RADIOLOGY REPORT*  Clinical Data: Shortness of breath  PORTABLE CHEST - 1 VIEW  Comparison: Chest x-ray of 03/15/2011  Findings: No active infiltrate or effusion is noted.  The heart is borderline enlarged.  No bony abnormality is seen.  IMPRESSION: Stable chest x-ray with borderline cardiomegaly.  No active lung disease.   Original Report Authenticated By: Dwyane Dee, M.D.    Scheduled Meds: . amLODipine  5 mg Oral Daily  . atenolol  25 mg Oral BID  . budesonide  9 mg Oral Daily  . cholestyramine  1 packet Oral Q24H  . demeclocycline  600 mg Oral Daily  . enoxaparin (LOVENOX) injection  40 mg Subcutaneous Q24H  . ferrous sulfate  325 mg Oral BID PC  . insulin aspart  0-9 Units Subcutaneous TID WC  . loratadine  10 mg Oral q1800  . pantoprazole  40 mg Oral Daily  . sodium chloride  3 mL Intravenous Q12H   Continuous Infusions: . sodium chloride 20 mL/hr (01/05/13 0602)    Principal Problem:   Generalized pain Active Problems:   CROHN'S DISEASE   GERD (gastroesophageal reflux disease)   Chronic hyponatremia   Anemia   Leukocytosis, unspecified   Weakness   Hypokalemia   Hypochloremia    Time spent: 35 minutes    [facility] M  [facility] Pager [phone]. If 7PM-7AM, please contact night-coverage at [email or web address], password [facility] 01/05/2013, 8:36 AM  LOS: 1 day    Attending: Patient seen and examined. 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 He now has arthralgia and possibly myalgia. His ESR is 95. I think it is important to make sure this man does not have malignancy. CT scan of the chest and abdomen and pelvis are appropriate.

## 2013-01-05 NOTE — Progress Notes (Signed)
UR chart review completed.  

## 2013-01-05 NOTE — Progress Notes (Signed)
Per lab sodium level ( from am labs) 118, Dr. Neoma Laming text paged.

## 2013-01-05 NOTE — Progress Notes (Signed)
THIS AM PT WAS UNABLE TO MOVE HIMSELF IN BED OR SIT  ON SIDE OF BED D/T PAIN AND JOINT TENDERNESS WHEN ASSISTED BY RN AND HIS WIFE. WIFE HAS LEFT TO GO HOME FOR A FEW HOURS. PT NOW TRYJNG TO GET OOB BY HIMSELF. ASSISTED TO COMM0DE W/ MINIMAL ASSIST. AND THEN BACK TO BED. INSTRUCTED NOT TO GET OOB ALONE, BUT TO CALLED FOR ASSISTANCE TO PREVENT INJURY OR FALL. CALL LIGHT IN REACH.

## 2013-01-06 ENCOUNTER — Inpatient Hospital Stay (HOSPITAL_COMMUNITY): Payer: Medicare Other

## 2013-01-06 DIAGNOSIS — E119 Type 2 diabetes mellitus without complications: Secondary | ICD-10-CM

## 2013-01-06 LAB — BASIC METABOLIC PANEL
CO2: 18 mEq/L — ABNORMAL LOW (ref 19–32)
Chloride: 82 mEq/L — ABNORMAL LOW (ref 96–112)
Glucose, Bld: 212 mg/dL — ABNORMAL HIGH (ref 70–99)
Potassium: 3.6 mEq/L (ref 3.5–5.1)
Sodium: 116 mEq/L — CL (ref 135–145)

## 2013-01-06 LAB — GLUCOSE, CAPILLARY: Glucose-Capillary: 227 mg/dL — ABNORMAL HIGH (ref 70–99)

## 2013-01-06 LAB — CBC
Platelets: 261 10*3/uL (ref 150–400)
RDW: 12.7 % (ref 11.5–15.5)
WBC: 13.5 10*3/uL — ABNORMAL HIGH (ref 4.0–10.5)

## 2013-01-06 LAB — SEDIMENTATION RATE: Sed Rate: 100 mm/hr — ABNORMAL HIGH (ref 0–16)

## 2013-01-06 MED ORDER — SODIUM CHLORIDE 1 G PO TABS
1.0000 g | ORAL_TABLET | Freq: Two times a day (BID) | ORAL | Status: DC
  Administered 2013-01-06 – 2013-01-07 (×2): 1 g via ORAL
  Filled 2013-01-06 (×6): qty 1

## 2013-01-06 MED ORDER — CYCLOBENZAPRINE HCL 10 MG PO TABS
5.0000 mg | ORAL_TABLET | Freq: Three times a day (TID) | ORAL | Status: DC | PRN
  Administered 2013-01-06: 5 mg via ORAL
  Filled 2013-01-06 (×2): qty 1

## 2013-01-06 MED ORDER — DEMECLOCYCLINE HCL 150 MG PO TABS
ORAL_TABLET | ORAL | Status: AC
  Filled 2013-01-06: qty 4

## 2013-01-06 MED ORDER — SODIUM CHLORIDE 3 % IV SOLN
INTRAVENOUS | Status: AC
  Administered 2013-01-06: 07:00:00 via INTRAVENOUS
  Filled 2013-01-06: qty 500

## 2013-01-06 MED ORDER — TRAMADOL HCL 50 MG PO TABS
50.0000 mg | ORAL_TABLET | Freq: Four times a day (QID) | ORAL | Status: DC | PRN
  Administered 2013-01-06 – 2013-01-07 (×5): 50 mg via ORAL
  Filled 2013-01-06 (×5): qty 1

## 2013-01-06 MED ORDER — DEMECLOCYCLINE HCL 150 MG PO TABS
600.0000 mg | ORAL_TABLET | Freq: Two times a day (BID) | ORAL | Status: DC
  Administered 2013-01-06: 600 mg via ORAL
  Filled 2013-01-06: qty 4

## 2013-01-06 MED ORDER — INSULIN ASPART 100 UNIT/ML ~~LOC~~ SOLN
0.0000 [IU] | Freq: Three times a day (TID) | SUBCUTANEOUS | Status: DC
  Administered 2013-01-06: 7 [IU] via SUBCUTANEOUS
  Administered 2013-01-06 (×2): 11 [IU] via SUBCUTANEOUS
  Administered 2013-01-07: 4 [IU] via SUBCUTANEOUS
  Administered 2013-01-07 (×2): 7 [IU] via SUBCUTANEOUS
  Administered 2013-01-08: 4 [IU] via SUBCUTANEOUS

## 2013-01-06 MED ORDER — DEMECLOCYCLINE HCL 150 MG PO TABS
300.0000 mg | ORAL_TABLET | Freq: Three times a day (TID) | ORAL | Status: DC
  Administered 2013-01-06 – 2013-01-07 (×4): 300 mg via ORAL
  Filled 2013-01-06 (×11): qty 2

## 2013-01-06 MED ORDER — SODIUM CHLORIDE 3 % IV SOLN
INTRAVENOUS | Status: AC
  Filled 2013-01-06: qty 500

## 2013-01-06 MED ORDER — INSULIN DETEMIR 100 UNIT/ML ~~LOC~~ SOLN
5.0000 [IU] | Freq: Every day | SUBCUTANEOUS | Status: DC
  Administered 2013-01-06 – 2013-01-07 (×2): 5 [IU] via SUBCUTANEOUS
  Filled 2013-01-06 (×3): qty 0.05

## 2013-01-06 NOTE — Progress Notes (Signed)
[facility] PROGRESS NOTE  [name] ZOX:[id number] DOB: [birth year] DOA: 01/04/2013 PCP: Zachery Dauer, MD  Assessment/Plan: Generalized pain: Etiology unclear: Total CK within limits of normal. Sedimentation rate elevated at 95. Uric acid low. Slight improvement this am. Cancer antigen in process, CT of pelvis, abdomen unremarkable. CT chest  Yields multiple nodules within the left lung, the largest of which  measures up to 1.0 cm. While these may be secondary to a post  infectious/inflammatory process, given the remote history of renal  malignancy, metastatic disease is a consideration.Pt will need OP follow up with Chest clinic. Continue pain medicine as needed.   Hyponatremia: continues to trend today. History of chronic hyponatremia. Serum osmolality 257 urine osmolality 831 and urine sodium 113. Appreciate nephrology assistance.  Monitor   Leukocytosis, unspecified: Etiology unclear. Chest x-ray and urinalysis unremarkable. Stable at 13. Afebrile. Supportive therapy in the form of IV fluids. Hemodynamically stable   Hypokalemia: repleted and resolved. Will monitor   Diabetes: uncontrolled. HgA1c 7.8. Takes metformin at home. Holding for now. CBG range 212-184. Will increase SSI to resistannt and add basal coverage. Monitor.    Code Status: full Family Communication: none present Disposition Plan: home when ready hopefully 24-48 hrs.   Consultants:  nephrology  Procedures:  none  Antibiotics:  none  HPI/Subjective: Sitting on side of bed. NAD  Objective: Filed Vitals:   01/06/13 0800  BP:   Pulse:   Temp: 98.4 F (36.9 C)  Resp: 19    Intake/Output Summary (Last 24 hours) at 01/06/13 0842 Last data filed at 01/06/13 0100  Gross per 24 hour  Intake    700 ml  Output    850 ml  Net   -150 ml   Filed Weights   01/04/13 1144 01/05/13 0500 01/06/13 0500  Weight: 162 lb 14.7 oz (73.9 kg) 186 lb 1.1 oz (84.4 kg) 193 lb 12.6 oz (87.9 kg)     Exam:   General:  Obese NAD  Cardiovascular: RRR No mgr no LE edema  Respiratory: normal effort BS clear to auscultation bilaterally no wheeze  Abdomen: obese soft +BS non-distended non-tender   Musculoskeletal: slight improvement to ROM of shoulders. Less tenderness to joints.    Data Reviewed: Basic Metabolic Panel:  Recent Labs Lab 01/04/13 0912 01/04/13 2014 01/05/13 0457 01/06/13 0514  NA 120* 117* 118* 116*  K 3.2* 3.4* 3.5 3.6  CL 77* 81* 86* 82*  CO2 25 20 19  18*  GLUCOSE 203* 238* 191* 212*  BUN 12 10 9 10   CREATININE 0.93 0.79 0.71 0.70  CALCIUM 9.5 8.9 8.9 9.0   Liver Function Tests:  Recent Labs Lab 01/04/13 0912 01/05/13 0457  AST 15 14  ALT 19 16  ALKPHOS 44 40  BILITOT 1.3* 1.0  PROT 8.3 7.2  ALBUMIN 3.8 3.1*   No results found for this basename: LIPASE, AMYLASE,  in the last 168 hours No results found for this basename: AMMONIA,  in the last 168 hours CBC:  Recent Labs Lab 01/04/13 0912 01/05/13 0457 01/06/13 0514  WBC 14.0* 13.5* 13.5*  NEUTROABS 11.0*  --   --   HGB 14.0 12.8* 12.6*  HCT 38.5* 34.9* 33.9*  MCV 82.1 81.4 80.9  PLT 265 271 261   Cardiac Enzymes:  Recent Labs Lab 01/04/13 0912 01/04/13 1559  CKTOTAL 124  --   CKMB 2.3  --   TROPONINI <0.30 <0.30   BNP (last 3 results)  Recent Labs  01/04/13 0912  PROBNP 108.2  CBG:  Recent Labs Lab 01/05/13 0732 01/05/13 1140 01/05/13 1634 01/05/13 2056 01/06/13 0745  GLUCAP 184* 225* 199* 272* 199*    Recent Results (from the past 240 hour(s))  CULTURE, BLOOD (ROUTINE X 2)     Status: None   Collection Time    01/04/13  9:12 AM      Result Value Range Status   Specimen Description BLOOD RIGHT ANTECUBITAL DRAWN BY RN CF   Final   Special Requests BOTTLES DRAWN AEROBIC ONLY 6CC   Final   Culture NO GROWTH 1 DAY   Final   Report Status PENDING   Incomplete  CULTURE, BLOOD (ROUTINE X 2)     Status: None   Collection Time    01/04/13  9:38 AM       Result Value Range Status   Specimen Description BLOOD LEFT ANTECUBITAL   Final   Special Requests     Final   Value: BOTTLES DRAWN AEROBIC AND ANAEROBIC AEB=15CC ANA=10CC   Culture NO GROWTH 1 DAY   Final   Report Status PENDING   Incomplete  MRSA PCR SCREENING     Status: None   Collection Time    01/04/13 11:23 AM      Result Value Range Status   MRSA by PCR NEGATIVE  NEGATIVE Final   Comment:            The GeneXpert MRSA Assay (FDA     approved for NASAL specimens     only), is one component of a     comprehensive MRSA colonization     surveillance program. It is not     intended to diagnose MRSA     infection nor to guide or     monitor treatment for     MRSA infections.     Studies: Ct Chest W Contrast  01/05/2013   *RADIOLOGY REPORT*  Clinical Data:  Chronic hyponatremia.  Evaluate for malignancy.  CT CHEST, ABDOMEN AND PELVIS WITH CONTRAST  Technique:  Multidetector CT imaging of the chest, abdomen and pelvis was performed following the standard protocol during bolus administration of intravenous contrast.  Contrast: OMNIPAQUE IOHEXOL 300 MG/ML  SOLN  Comparison:  CT abdomen dated 04/18/2007.  CT CHEST  Findings:  Visualized thyroid is unremarkable.  There is no axillary, mediastinal or hilar lymphadenopathy.  There is a 0.6 cm prevascular lymph node and a 0.8 cm right paratracheal lymph node. Normal heart size.  Coronary artery calcifications.  No pericardial effusion.  The central airways are patent.  Minimal scarring and/or atelectasis within the right lower lobe. Postsurgical change compatible with right lower lobe wedge resection.  There are multiple left lung pulmonary nodules.  Reference nodule in the left upper lobe measures 6 mm (image 13); left upper lobe measuring 7 mm (image 25); left lower lobe measuring 1.0 cm (image 25).  No pleural effusion or pneumothorax.  No aggressive osseous lesions.  Healed old bilateral rib fractures.  IMPRESSION: 1. Multiple nodules  within the left lung, the largest of which measures up to 1.0 cm. While these may be secondary to a post infectious/inflammatory process, given the remote history of renal malignancy, metastatic disease is a consideration.  CT ABDOMEN AND PELVIS  Findings:  Evaluation of the abdomen is limited secondary to motion artifact.  Liver is normal in size and contour without focal hepatic lesion identified.  Portal vein is patent.  Status post cholecystectomy.  Spleen is grossly unremarkable.  Stable rim calcified mass along  the superior aspect of the left kidney measuring up to 6.5 cm, most compatible with postoperative change. Additional small amount of soft tissue along the pancreatic tail measuring up to 2.0 cm is stable dating back to 2008 and compatible with benign etiology.  The pancreas and bilateral adrenal glands are unremarkable.  The kidneys enhance symmetrically with contrast. Unchanged 2.2 cm soft tissue mass anterior to the interpolar region of the left kidney (image 75; series 3) dating back to 2008, likely benign postoperative change.  Normal caliber abdominal aorta.  No retroperitoneal lymphadenopathy.  Urinary bladder is grossly unremarkable. Prostate is unremarkable.  Oral contrast material is demonstrated throughout the bowel.  No evidence for bowel obstruction.  No abnormal bowel wall thickening. No free fluid or free intraperitoneal air.  Colonic gas and stranding within the sub the fat of the anterior abdominal wall, likely sequelae of prior percutaneous injection.  No aggressive appearing osseous lesion.  Lower lumbar spine degenerative change.  IMPRESSION:  Unchanged peripherally calcified soft tissue mass adjacent to the superior aspect of the left kidney and a second mass along the anterior margin of the left kidney, these are stable dating back to 2008 and likely sequela of prior surgery.   Original Report Authenticated By: Annia Belt, M.D   Ct Abdomen Pelvis W Contrast  01/05/2013    *RADIOLOGY REPORT*  Clinical Data:  Chronic hyponatremia.  Evaluate for malignancy.  CT CHEST, ABDOMEN AND PELVIS WITH CONTRAST  Technique:  Multidetector CT imaging of the chest, abdomen and pelvis was performed following the standard protocol during bolus administration of intravenous contrast.  Contrast: OMNIPAQUE IOHEXOL 300 MG/ML  SOLN  Comparison:  CT abdomen dated 04/18/2007.  CT CHEST  Findings:  Visualized thyroid is unremarkable.  There is no axillary, mediastinal or hilar lymphadenopathy.  There is a 0.6 cm prevascular lymph node and a 0.8 cm right paratracheal lymph node. Normal heart size.  Coronary artery calcifications.  No pericardial effusion.  The central airways are patent.  Minimal scarring and/or atelectasis within the right lower lobe. Postsurgical change compatible with right lower lobe wedge resection.  There are multiple left lung pulmonary nodules.  Reference nodule in the left upper lobe measures 6 mm (image 13); left upper lobe measuring 7 mm (image 25); left lower lobe measuring 1.0 cm (image 25).  No pleural effusion or pneumothorax.  No aggressive osseous lesions.  Healed old bilateral rib fractures.  IMPRESSION: 1. Multiple nodules within the left lung, the largest of which measures up to 1.0 cm. While these may be secondary to a post infectious/inflammatory process, given the remote history of renal malignancy, metastatic disease is a consideration.  CT ABDOMEN AND PELVIS  Findings:  Evaluation of the abdomen is limited secondary to motion artifact.  Liver is normal in size and contour without focal hepatic lesion identified.  Portal vein is patent.  Status post cholecystectomy.  Spleen is grossly unremarkable.  Stable rim calcified mass along the superior aspect of the left kidney measuring up to 6.5 cm, most compatible with postoperative change. Additional small amount of soft tissue along the pancreatic tail measuring up to 2.0 cm is stable dating back to 2008 and compatible  with benign etiology.  The pancreas and bilateral adrenal glands are unremarkable.  The kidneys enhance symmetrically with contrast. Unchanged 2.2 cm soft tissue mass anterior to the interpolar region of the left kidney (image 75; series 3) dating back to 2008, likely benign postoperative change.  Normal caliber abdominal aorta.  No  retroperitoneal lymphadenopathy.  Urinary bladder is grossly unremarkable. Prostate is unremarkable.  Oral contrast material is demonstrated throughout the bowel.  No evidence for bowel obstruction.  No abnormal bowel wall thickening. No free fluid or free intraperitoneal air.  Colonic gas and stranding within the sub the fat of the anterior abdominal wall, likely sequelae of prior percutaneous injection.  No aggressive appearing osseous lesion.  Lower lumbar spine degenerative change.  IMPRESSION:  Unchanged peripherally calcified soft tissue mass adjacent to the superior aspect of the left kidney and a second mass along the anterior margin of the left kidney, these are stable dating back to 2008 and likely sequela of prior surgery.   Original Report Authenticated By: Annia Belt, M.D   Dg Chest Port 1 View  01/04/2013   *RADIOLOGY REPORT*  Clinical Data: Shortness of breath  PORTABLE CHEST - 1 VIEW  Comparison: Chest x-ray of 03/15/2011  Findings: No active infiltrate or effusion is noted.  The heart is borderline enlarged.  No bony abnormality is seen.  IMPRESSION: Stable chest x-ray with borderline cardiomegaly.  No active lung disease.   Original Report Authenticated By: Dwyane Dee, M.D.    Scheduled Meds: . amLODipine  5 mg Oral Daily  . atenolol  25 mg Oral BID  . budesonide  9 mg Oral Daily  . cholestyramine  1 packet Oral Q24H  . demeclocycline  300 mg Oral Q8H  . enoxaparin (LOVENOX) injection  40 mg Subcutaneous Q24H  . ferrous sulfate  325 mg Oral BID PC  . insulin aspart  0-20 Units Subcutaneous TID WC  . insulin aspart  0-5 Units Subcutaneous QHS  . insulin  detemir  5 Units Subcutaneous QHS  . loratadine  10 mg Oral q1800  . pantoprazole  40 mg Oral Daily  . sodium chloride  3 mL Intravenous Q12H  . sodium chloride  1 g Oral BID WC   Continuous Infusions:   Principal Problem:   Generalized pain Active Problems:   CROHN'S DISEASE   GERD (gastroesophageal reflux disease)   Chronic hyponatremia   Anemia   Leukocytosis, unspecified   Weakness   Hypokalemia   Hypochloremia   Diabetes    Time spent: 30 minutes    [facility] M  [facility] Pager [phone]. If 7PM-7AM, please contact night-coverage at [email or web address], password [facility] 01/06/2013, 8:42 AM  LOS: 2 days   Attending: Patient seen and examined. He has been confused, likely secondary to intravenous opioids last night. His hyponatremia has worsened. CT scan of the chest is showing pulmonary nodules, this will need investigation as an outpatient with PET scan. We'll ask nephrology to help [place] with this patient's hyponatremia.

## 2013-01-06 NOTE — Progress Notes (Signed)
Pt transfering to room 322. Alert and oriented. Room air. Denies any sob or discomfort at this time. Restless and walking around in room. Poor po intake.transfer report  Given to [employer] on 300. Marland Kitchen

## 2013-01-06 NOTE — Care Management Note (Unsigned)
    Page 1 of 1   01/06/2013     3:41:04 PM   CARE MANAGEMENT NOTE 01/06/2013  Patient:  [name], [name]   Account Number:  [id number]  Date Initiated:  01/06/2013  Documentation initiated by:  Sharrie Rothman  Subjective/Objective Assessment:   Pt is admitted from home with hyponatremia. Pt lives with his wife and will return home at discharge. Pt has a cane and walker.     Action/Plan:   Will continue to follow for [facility] needs.   Anticipated DC Date:  01/11/2013   Anticipated DC Plan:  HOME/SELF CARE      DC Planning Services  CM consult      Choice offered to / List presented to:             Status of service:  In process, will continue to follow Medicare Important Message given?   (If response is "NO", the following Medicare IM given date fields will be blank) Date Medicare IM given:   Date Additional Medicare IM given:    Discharge Disposition:    Per UR Regulation:    If discussed at Long Length of Stay Meetings, dates discussed:    Comments:  01/06/13 1540 Arlyss Queen, RN BSN CM Pt will need to be set up with Thoracic Clinic at [facility] at discharge. Phone number [phone] and speak with Tiffany or Annabelle Harman to arrange followup at clinic.
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
## 2013-01-06 NOTE — Progress Notes (Signed)
Inpatient Diabetes Program Recommendations  AACE/ADA: New Consensus Statement on Inpatient Glycemic Control (2013)  Target Ranges:  Prepandial:   less than 140 mg/dL      Peak postprandial:   less than 180 mg/dL (1-2 hours)      Critically ill patients:  140 - 180 mg/dL   Results for SHRAY, HUNLEY (MRN [id number]) as of 01/06/2013 08:23  Ref. Range 01/05/2013 07:32 01/05/2013 11:40 01/05/2013 16:34 01/05/2013 20:56 01/06/2013 07:45  Glucose-Capillary Latest Range: 70-99 mg/dL 409 (H) 811 (H) 914 (H) 272 (H) 199 (H)    Inpatient Diabetes Program Recommendations Insulin - Basal: If glucose continues to be consistently greater than 180 mg/dl, may want to order low dose basal insulin (recommend starting with Levemir 5 units daily). Correction (SSI): Please consider increasing Novolog correction to resistant scale.  Note: Patient has a history of diabetes and takes Glipizide 10 mg BID and Metformin 1000 mg BID at home for diabetes management.  Currently, patient is ordered to receive  Novolog 0-15 units AC and Novolog 0-5 units HS for inpatient glycemic control.  Blood glucose over the past 24 hours has ranged from 184-272 mg/dl and fasting blood glucose this morning was 199 mg/dl. Please consider increasing Novolog correction to resistant scale.  If glucose continues to be consistently greater than 180 mg/dl, may want to order low dose basal insulin.  Will continue to follow.  Thanks, Orlando Penner, RN, MSN, CCRN Diabetes Coordinator Inpatient Diabetes Program [phone]

## 2013-01-07 LAB — BASIC METABOLIC PANEL
Calcium: 9 mg/dL (ref 8.4–10.5)
Creatinine, Ser: 0.73 mg/dL (ref 0.50–1.35)
GFR calc non Af Amer: >90 mL/min (ref >90)
Glucose, Bld: 206 mg/dL — ABNORMAL HIGH (ref 70–99)
Sodium: 118 mEq/L — CL (ref 135–145)

## 2013-01-07 LAB — GLUCOSE, CAPILLARY
Glucose-Capillary: 180 mg/dL — ABNORMAL HIGH (ref 70–99)
Glucose-Capillary: 247 mg/dL — ABNORMAL HIGH (ref 70–99)

## 2013-01-07 MED ORDER — POTASSIUM CHLORIDE IN NACL 20-0.9 MEQ/L-% IV SOLN
INTRAVENOUS | Status: DC
  Administered 2013-01-07 (×2): via INTRAVENOUS

## 2013-01-07 MED ORDER — SODIUM CHLORIDE 1 G PO TABS
2.0000 g | ORAL_TABLET | Freq: Two times a day (BID) | ORAL | Status: DC
  Administered 2013-01-07 – 2013-01-08 (×2): 2 g via ORAL
  Filled 2013-01-07 (×4): qty 2

## 2013-01-07 MED ORDER — DEMECLOCYCLINE HCL 150 MG PO TABS
300.0000 mg | ORAL_TABLET | Freq: Four times a day (QID) | ORAL | Status: DC
  Administered 2013-01-07 (×3): 300 mg via ORAL
  Filled 2013-01-07 (×9): qty 2

## 2013-01-07 NOTE — Progress Notes (Signed)
[name] ZOX:[id number] DOB: 1956-05-25 DOA: 01/04/2013 PCP: Zachery Dauer, MD   Subjective: This man has become more confused again as well as drowsy.           Physical Exam: Blood pressure 157/90, pulse 104, temperature 97.9 F (36.6 C), temperature source Oral, resp. rate 20, height 5\' 10"  (1.778 m), weight 87.9 kg (193 lb 12.6 oz), SpO2 98.00%. He is intermittently drowsy. He does respond to voice. He appears to be moving all his limbs.   Investigations:  Recent Results (from the past 240 hour(s))  CULTURE, BLOOD (ROUTINE X 2)     Status: None   Collection Time    01/04/13  9:12 AM      Result Value Range Status   Specimen Description BLOOD RIGHT ANTECUBITAL DRAWN BY RN CF   Final   Special Requests BOTTLES DRAWN AEROBIC ONLY 6CC   Final   Culture NO GROWTH 3 DAYS   Final   Report Status PENDING   Incomplete  CULTURE, BLOOD (ROUTINE X 2)     Status: None   Collection Time    01/04/13  9:38 AM      Result Value Range Status   Specimen Description BLOOD LEFT ANTECUBITAL   Final   Special Requests     Final   Value: BOTTLES DRAWN AEROBIC AND ANAEROBIC AEB=15CC ANA=10CC   Culture NO GROWTH 3 DAYS   Final   Report Status PENDING   Incomplete  MRSA PCR SCREENING     Status: None   Collection Time    01/04/13 11:23 AM      Result Value Range Status   MRSA by PCR NEGATIVE  NEGATIVE Final   Comment:            The GeneXpert MRSA Assay (FDA     approved for NASAL specimens     only), is one component of a     comprehensive MRSA colonization     surveillance program. It is not     intended to diagnose MRSA     infection nor to guide or     monitor treatment for     MRSA infections.     Basic Metabolic Panel:  Recent Labs  81/19/14 0514 01/07/13 0622  NA 116* 118*  K 3.6 3.4*  CL 82* 82*  CO2 18* 19  GLUCOSE 212* 206*  BUN 10 11  CREATININE 0.70 0.73  CALCIUM 9.0 9.0   Liver Function Tests:  Recent Labs  01/05/13 0457  AST 14  ALT 16   ALKPHOS 40  BILITOT 1.0  PROT 7.2  ALBUMIN 3.1*     CBC:  Recent Labs  01/05/13 0457 01/06/13 0514  WBC 13.5* 13.5*  HGB 12.8* 12.6*  HCT 34.9* 33.9*  MCV 81.4 80.9  PLT 271 261    Ct Chest W Contrast  01/05/2013   *RADIOLOGY REPORT*  Clinical Data:  Chronic hyponatremia.  Evaluate for malignancy.  CT CHEST, ABDOMEN AND PELVIS WITH CONTRAST  Technique:  Multidetector CT imaging of the chest, abdomen and pelvis was performed following the standard protocol during bolus administration of intravenous contrast.  Contrast: OMNIPAQUE IOHEXOL 300 MG/ML  SOLN  Comparison:  CT abdomen dated 04/18/2007.  CT CHEST  Findings:  Visualized thyroid is unremarkable.  There is no axillary, mediastinal or hilar lymphadenopathy.  There is a 0.6 cm prevascular lymph node and a 0.8 cm right paratracheal lymph node. Normal heart size.  Coronary artery calcifications.  No pericardial effusion.  The central airways are patent.  Minimal scarring and/or atelectasis within the right lower lobe. Postsurgical change compatible with right lower lobe wedge resection.  There are multiple left lung pulmonary nodules.  Reference nodule in the left upper lobe measures 6 mm (image 13); left upper lobe measuring 7 mm (image 25); left lower lobe measuring 1.0 cm (image 25).  No pleural effusion or pneumothorax.  No aggressive osseous lesions.  Healed old bilateral rib fractures.  IMPRESSION: 1. Multiple nodules within the left lung, the largest of which measures up to 1.0 cm. While these may be secondary to a post infectious/inflammatory process, given the remote history of renal malignancy, metastatic disease is a consideration.  CT ABDOMEN AND PELVIS  Findings:  Evaluation of the abdomen is limited secondary to motion artifact.  Liver is normal in size and contour without focal hepatic lesion identified.  Portal vein is patent.  Status post cholecystectomy.  Spleen is grossly unremarkable.  Stable rim calcified mass along  the superior aspect of the left kidney measuring up to 6.5 cm, most compatible with postoperative change. Additional small amount of soft tissue along the pancreatic tail measuring up to 2.0 cm is stable dating back to 2008 and compatible with benign etiology.  The pancreas and bilateral adrenal glands are unremarkable.  The kidneys enhance symmetrically with contrast. Unchanged 2.2 cm soft tissue mass anterior to the interpolar region of the left kidney (image 75; series 3) dating back to 2008, likely benign postoperative change.  Normal caliber abdominal aorta.  No retroperitoneal lymphadenopathy.  Urinary bladder is grossly unremarkable. Prostate is unremarkable.  Oral contrast material is demonstrated throughout the bowel.  No evidence for bowel obstruction.  No abnormal bowel wall thickening. No free fluid or free intraperitoneal air.  Colonic gas and stranding within the sub the fat of the anterior abdominal wall, likely sequelae of prior percutaneous injection.  No aggressive appearing osseous lesion.  Lower lumbar spine degenerative change.  IMPRESSION:  Unchanged peripherally calcified soft tissue mass adjacent to the superior aspect of the left kidney and a second mass along the anterior margin of the left kidney, these are stable dating back to 2008 and likely sequela of prior surgery.   Original Report Authenticated By: Annia Belt, M.D   Ct Abdomen Pelvis W Contrast  01/05/2013   *RADIOLOGY REPORT*  Clinical Data:  Chronic hyponatremia.  Evaluate for malignancy.  CT CHEST, ABDOMEN AND PELVIS WITH CONTRAST  Technique:  Multidetector CT imaging of the chest, abdomen and pelvis was performed following the standard protocol during bolus administration of intravenous contrast.  Contrast: OMNIPAQUE IOHEXOL 300 MG/ML  SOLN  Comparison:  CT abdomen dated 04/18/2007.  CT CHEST  Findings:  Visualized thyroid is unremarkable.  There is no axillary, mediastinal or hilar lymphadenopathy.  There is a 0.6 cm  prevascular lymph node and a 0.8 cm right paratracheal lymph node. Normal heart size.  Coronary artery calcifications.  No pericardial effusion.  The central airways are patent.  Minimal scarring and/or atelectasis within the right lower lobe. Postsurgical change compatible with right lower lobe wedge resection.  There are multiple left lung pulmonary nodules.  Reference nodule in the left upper lobe measures 6 mm (image 13); left upper lobe measuring 7 mm (image 25); left lower lobe measuring 1.0 cm (image 25).  No pleural effusion or pneumothorax.  No aggressive osseous lesions.  Healed old bilateral rib fractures.  IMPRESSION: 1. Multiple nodules within the left lung, the largest of which measures up  to 1.0 cm. While these may be secondary to a post infectious/inflammatory process, given the remote history of renal malignancy, metastatic disease is a consideration.  CT ABDOMEN AND PELVIS  Findings:  Evaluation of the abdomen is limited secondary to motion artifact.  Liver is normal in size and contour without focal hepatic lesion identified.  Portal vein is patent.  Status post cholecystectomy.  Spleen is grossly unremarkable.  Stable rim calcified mass along the superior aspect of the left kidney measuring up to 6.5 cm, most compatible with postoperative change. Additional small amount of soft tissue along the pancreatic tail measuring up to 2.0 cm is stable dating back to 2008 and compatible with benign etiology.  The pancreas and bilateral adrenal glands are unremarkable.  The kidneys enhance symmetrically with contrast. Unchanged 2.2 cm soft tissue mass anterior to the interpolar region of the left kidney (image 75; series 3) dating back to 2008, likely benign postoperative change.  Normal caliber abdominal aorta.  No retroperitoneal lymphadenopathy.  Urinary bladder is grossly unremarkable. Prostate is unremarkable.  Oral contrast material is demonstrated throughout the bowel.  No evidence for bowel  obstruction.  No abnormal bowel wall thickening. No free fluid or free intraperitoneal air.  Colonic gas and stranding within the sub the fat of the anterior abdominal wall, likely sequelae of prior percutaneous injection.  No aggressive appearing osseous lesion.  Lower lumbar spine degenerative change.  IMPRESSION:  Unchanged peripherally calcified soft tissue mass adjacent to the superior aspect of the left kidney and a second mass along the anterior margin of the left kidney, these are stable dating back to 2008 and likely sequela of prior surgery.   Original Report Authenticated By: Annia Belt, M.D   Dg Hand Complete Right  01/06/2013   *RADIOLOGY REPORT*  Clinical Data: Hand swelling, redness, and pain.  RIGHT HAND - COMPLETE 3+ VIEW  Comparison: None.  Findings: Three-view study shows no fracture.  No subluxation or dislocation.  No worrisome lytic or sclerotic osseous abnormality.  IMPRESSION: No acute bony findings.   Original Report Authenticated By: Kennith Center, M.D.      Medications: I have reviewed the patient's current medications.  Impression: 1. Hyponatremia, secondary to SIADH. Etiology of SIADH is not clear. 2. Arthralgia and myalgia, unclear etiology. 3. Elevated ESR. 4. Diabetes mellitus.  5. Pulmonary nodules, unclear etiology. I wonder whether this man has a paraneoplastic syndrome.    Plan: 1. Appreciate nephrology input with regard to hyponatremia. Continue with the Democycline.  Consultants:  Nephrology, Dr Fausto Skillern.   Procedures:  None.   Antibiotics:  None.                   Code Status: Full code.  Family Communication: Discussed plan with patient's wife at the bedside.   Disposition Plan: Home when medically stable.  Time spent: 15 minutes.   LOS: 3 days   Wilson Singer Pager [phone]  01/07/2013, 10:36 AM

## 2013-01-07 NOTE — Progress Notes (Signed)
Subjective: Interval History: has no complaint of nausea or vomiting. He says that he still  weak better than when he came. Patient denies any difficulty breathing.  Objective: Vital signs in last 24 hours: Temp:  [97.9 F (36.6 C)-99.8 F (37.7 C)] 97.9 F (36.6 C) (08/08 2053) Pulse Rate:  [104] 104 (08/08 2053) Resp:  [20-23] 20 (08/08 2053) BP: (157)/(90) 157/90 mmHg (08/08 2053) SpO2:  [98 %] 98 % (08/08 2053) Weight change:   Intake/Output from previous day: 08/08 0701 - 08/09 0700 In: 960 [P.O.:960] Out: 700 [Urine:700] Intake/Output this shift: Total I/O In: 360 [P.O.:360] Out: -   General appearance: alert, cooperative and no distress Resp: clear to auscultation bilaterally Cardio: regular rate and rhythm, S1, S2 normal, no murmur, click, rub or gallop GI: soft, non-tender; bowel sounds normal; no masses,  no organomegaly Extremities: extremities normal, atraumatic, no cyanosis or edema  Lab Results:  Recent Labs  01/05/13 0457 01/06/13 0514  WBC 13.5* 13.5*  HGB 12.8* 12.6*  HCT 34.9* 33.9*  PLT 271 261   BMET:  Recent Labs  01/06/13 0514 01/07/13 0622  NA 116* 118*  K 3.6 3.4*  CL 82* 82*  CO2 18* 19  GLUCOSE 212* 206*  BUN 10 11  CREATININE 0.70 0.73  CALCIUM 9.0 9.0   No results found for this basename: PTH,  in the last 72 hours Iron Studies: No results found for this basename: IRON, TIBC, TRANSFERRIN, FERRITIN,  in the last 72 hours  Studies/Results: Ct Chest W Contrast  01/05/2013   *RADIOLOGY REPORT*  Clinical Data:  Chronic hyponatremia.  Evaluate for malignancy.  CT CHEST, ABDOMEN AND PELVIS WITH CONTRAST  Technique:  Multidetector CT imaging of the chest, abdomen and pelvis was performed following the standard protocol during bolus administration of intravenous contrast.  Contrast: OMNIPAQUE IOHEXOL 300 MG/ML  SOLN  Comparison:  CT abdomen dated 04/18/2007.  CT CHEST  Findings:  Visualized thyroid is unremarkable.  There is no  axillary, mediastinal or hilar lymphadenopathy.  There is a 0.6 cm prevascular lymph node and a 0.8 cm right paratracheal lymph node. Normal heart size.  Coronary artery calcifications.  No pericardial effusion.  The central airways are patent.  Minimal scarring and/or atelectasis within the right lower lobe. Postsurgical change compatible with right lower lobe wedge resection.  There are multiple left lung pulmonary nodules.  Reference nodule in the left upper lobe measures 6 mm (image 13); left upper lobe measuring 7 mm (image 25); left lower lobe measuring 1.0 cm (image 25).  No pleural effusion or pneumothorax.  No aggressive osseous lesions.  Healed old bilateral rib fractures.  IMPRESSION: 1. Multiple nodules within the left lung, the largest of which measures up to 1.0 cm. While these may be secondary to a post infectious/inflammatory process, given the remote history of renal malignancy, metastatic disease is a consideration.  CT ABDOMEN AND PELVIS  Findings:  Evaluation of the abdomen is limited secondary to motion artifact.  Liver is normal in size and contour without focal hepatic lesion identified.  Portal vein is patent.  Status post cholecystectomy.  Spleen is grossly unremarkable.  Stable rim calcified mass along the superior aspect of the left kidney measuring up to 6.5 cm, most compatible with postoperative change. Additional small amount of soft tissue along the pancreatic tail measuring up to 2.0 cm is stable dating back to 2008 and compatible with benign etiology.  The pancreas and bilateral adrenal glands are unremarkable.  The kidneys enhance  symmetrically with contrast. Unchanged 2.2 cm soft tissue mass anterior to the interpolar region of the left kidney (image 75; series 3) dating back to 2008, likely benign postoperative change.  Normal caliber abdominal aorta.  No retroperitoneal lymphadenopathy.  Urinary bladder is grossly unremarkable. Prostate is unremarkable.  Oral contrast material is  demonstrated throughout the bowel.  No evidence for bowel obstruction.  No abnormal bowel wall thickening. No free fluid or free intraperitoneal air.  Colonic gas and stranding within the sub the fat of the anterior abdominal wall, likely sequelae of prior percutaneous injection.  No aggressive appearing osseous lesion.  Lower lumbar spine degenerative change.  IMPRESSION:  Unchanged peripherally calcified soft tissue mass adjacent to the superior aspect of the left kidney and a second mass along the anterior margin of the left kidney, these are stable dating back to 2008 and likely sequela of prior surgery.   Original Report Authenticated By: Annia Belt, M.D   Ct Abdomen Pelvis W Contrast  01/05/2013   *RADIOLOGY REPORT*  Clinical Data:  Chronic hyponatremia.  Evaluate for malignancy.  CT CHEST, ABDOMEN AND PELVIS WITH CONTRAST  Technique:  Multidetector CT imaging of the chest, abdomen and pelvis was performed following the standard protocol during bolus administration of intravenous contrast.  Contrast: OMNIPAQUE IOHEXOL 300 MG/ML  SOLN  Comparison:  CT abdomen dated 04/18/2007.  CT CHEST  Findings:  Visualized thyroid is unremarkable.  There is no axillary, mediastinal or hilar lymphadenopathy.  There is a 0.6 cm prevascular lymph node and a 0.8 cm right paratracheal lymph node. Normal heart size.  Coronary artery calcifications.  No pericardial effusion.  The central airways are patent.  Minimal scarring and/or atelectasis within the right lower lobe. Postsurgical change compatible with right lower lobe wedge resection.  There are multiple left lung pulmonary nodules.  Reference nodule in the left upper lobe measures 6 mm (image 13); left upper lobe measuring 7 mm (image 25); left lower lobe measuring 1.0 cm (image 25).  No pleural effusion or pneumothorax.  No aggressive osseous lesions.  Healed old bilateral rib fractures.  IMPRESSION: 1. Multiple nodules within the left lung, the largest of which  measures up to 1.0 cm. While these may be secondary to a post infectious/inflammatory process, given the remote history of renal malignancy, metastatic disease is a consideration.  CT ABDOMEN AND PELVIS  Findings:  Evaluation of the abdomen is limited secondary to motion artifact.  Liver is normal in size and contour without focal hepatic lesion identified.  Portal vein is patent.  Status post cholecystectomy.  Spleen is grossly unremarkable.  Stable rim calcified mass along the superior aspect of the left kidney measuring up to 6.5 cm, most compatible with postoperative change. Additional small amount of soft tissue along the pancreatic tail measuring up to 2.0 cm is stable dating back to 2008 and compatible with benign etiology.  The pancreas and bilateral adrenal glands are unremarkable.  The kidneys enhance symmetrically with contrast. Unchanged 2.2 cm soft tissue mass anterior to the interpolar region of the left kidney (image 75; series 3) dating back to 2008, likely benign postoperative change.  Normal caliber abdominal aorta.  No retroperitoneal lymphadenopathy.  Urinary bladder is grossly unremarkable. Prostate is unremarkable.  Oral contrast material is demonstrated throughout the bowel.  No evidence for bowel obstruction.  No abnormal bowel wall thickening. No free fluid or free intraperitoneal air.  Colonic gas and stranding within the sub the fat of the anterior abdominal wall, likely sequelae  of prior percutaneous injection.  No aggressive appearing osseous lesion.  Lower lumbar spine degenerative change.  IMPRESSION:  Unchanged peripherally calcified soft tissue mass adjacent to the superior aspect of the left kidney and a second mass along the anterior margin of the left kidney, these are stable dating back to 2008 and likely sequela of prior surgery.   Original Report Authenticated By: Annia Belt, M.D   Dg Hand Complete Right  01/06/2013   *RADIOLOGY REPORT*  Clinical Data: Hand swelling, redness,  and pain.  RIGHT HAND - COMPLETE 3+ VIEW  Comparison: None.  Findings: Three-view study shows no fracture.  No subluxation or dislocation.  No worrisome lytic or sclerotic osseous abnormality.  IMPRESSION: No acute bony findings.   Original Report Authenticated By: Kennith Center, M.D.    I have reviewed the patient'[name] current medications.  Assessment/Plan: Problem #1 hyponatremia possibly SIADH. Presently patient start on demeclocycline and water restriction. His sodium is 118 still remains low. Presently patient is still seems to be consuming lots of water and ice. He is the urine sodium is under 113 urine osmolality 831. Problem #2 hypokalemia Problem #3 Crohn'[name] disease Problem #5 hypertension his blood pressure seems to be reasonably controlled Problem #5 diabetes Problem #6 history of a weakness possibly related to his hyponatremia. Problem #7 leukocytosis. Plan: We'll start patient on normal saline with 20 medical KCl at 50 cc per hour We'll DC freewater intake and also ice. We'll increase his demeclocycline to 300 mg by mouth every 6 hours We'll increase his sodium tablets 2 g by mouth twice a day. We'll check his basic metabolic panel in the morning.  LOS: 3 days   [name] 01/07/2013,9:28 AM

## 2013-01-08 LAB — GLUCOSE, CAPILLARY: Glucose-Capillary: 194 mg/dL — ABNORMAL HIGH (ref 70–99)

## 2013-01-08 MED ORDER — DEMECLOCYCLINE HCL 150 MG PO TABS: 300.0000 mg | ORAL_TABLET | Freq: Four times a day (QID) | ORAL | Status: AC

## 2013-01-08 NOTE — Progress Notes (Signed)
Patient  Received discharge instructions along with prescription and follow up appointment. Patient refused medications. Patient verbalized understanding of all instructions. Patient was escorted by staff via wheelchair to vehicle. Patient discharged to home in stable condition.

## 2013-01-08 NOTE — Progress Notes (Signed)
Patient refused his medications and any treatments this morning.  Patient's wife was called and informed that he wanted to speak with her.  Patient attempted to get up by hisself last night without ringing for assistance once his sitter left.  He has an unsteady gait most of the time when moving in and out of the bed or to the bathroom.  Patient was assisted to the chair, bed and bathroom throughout the shift.  He was asked if he wanted pain medication when he could have them and refused except at bedtime.  Patient stated that he wanted his sleeping pill and could sleep at night "If we didn't let him sleep all ... Day."

## 2013-01-08 NOTE — Discharge Summary (Addendum)
Physician Discharge Summary  [name] NFA:[id number] DOB: 12-Dec-1955 DOA: 01/04/2013  PCP: Zachery Dauer, MD  Admit date: 01/04/2013 Discharge date: 01/08/2013  Time spent: Greater than 30 minutes  Recommendations for Outpatient Follow-up:  1. Followup with multidisciplinary thoracic oncology clinic in [place] for investigation of pulmonary nodules.  Discharge Diagnoses:  1. Hyponatremia secondary to SIADH. Etiology of SIADH unclear. 2. Pulmonary nodules, concerning for possible malignancy. ESR significantly elevated at 95. 3. Arthralgia and myalgia with some skin changes,? 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 4. Chronic disease, stable, on steroids.   Discharge Condition: Stable.  Diet recommendation: Carbohydrate modified diet.  Filed Weights   01/04/13 1144 01/05/13 0500 01/06/13 0500  Weight: 73.9 kg (162 lb 14.7 oz) 84.4 kg (186 lb 1.1 oz) 87.9 kg (193 lb 12.6 oz)    History of present illness:  This unfortunate 57 year old man presented to the hospital with symptoms of weakness .Marland Kitchen Please see initial history as outlined below: HPI: [name] is a 57 y.o. male with past medical history that includes chronic hyponatremia, Crohn's disease, anemia, diabetes, osteoporosis, hypertension, colonic diverticula, GERD, presents to the emergency room with chief complaint of weakness and myalgia. Information is obtained from the patient and the wife is at the bedside. He reports developing generalized weakness and overall joint pain over the last 4 days that worsened last night. Attempted to to get up and was unable to bear weight do to weakness and pain in his right knee. He also reports developing pain in his left wrist left elbow left shoulder that spread to his left arm. He took his usual pain medicine without relief. Associated symptoms include chills objective fever nausea no vomiting. Patient does report a long history of hyponatremia but indicates  symptoms currently experiencing are different from previous symptoms when his sodium was very low. He does report he went to his primary care provider yesterday which indicated an elevated white count and a sodium of 120. At that time he was given a prescription for Declomycin. He states that this is his usual treatment when he is hospitalized for hyponatremia. He has taken one dose. He also indicates that he was instructed to stop taking fenofibrate. Evidence came on gradually have persisted characterized as moderate. Lab work in the emergency room significant for white count of 14.0 sodium of 120 potassium 3.2 chloride 77. Chest x-ray with borderline cardiomegaly and no active lung disease. EKG yields normal sinus rhythm. Vital signs significant for a normal temperature of 99.8.  Hospital Course:  The patient was admitted and found to be hyponatremic. He was treated with democycline which he previously had been on for SIADH. The etiology of his SIADH are entirely clear. He underwent CT scan of his chest and abdomen and pelvis. The findings are listed in this discharge summary but in essence he was found pulmonary nodules, one of them is a 1 cm diameter. He would benefit from a PET scan. He was seen by nephrology, Dr Fausto Skillern who is managing his hyponatremia. Unfortunately the patient became rather frustrated and wants to be discharged. He has been stable and although he did have episodes of confusion, today he is much clearer. I think the episodes of confusion were related to opiate medications for pain. He is very keen to be discharged home. I've explained to him and his wife that he may have a paraneoplastic syndrome related to malignancy possibly from lung. I've explained to his wife that he may well require PET scan and  be followed up in a multidisciplinary thoracic oncology clinic in [place]. She will call tomorrow. He stable for discharge now.  Procedures:  None.   Consultations:  Nephrology,  Dr Fausto Skillern.  Discharge Exam: Filed Vitals:   01/07/13 2054  BP: 145/83  Pulse: 94  Resp: 20    General: He looks systemically well. Is not toxic or septic. He is not confused. Cardiovascular: Heart sounds are present without murmurs or added sounds. Respiratory: Lung fields are clear. He is alert and orientated. There are no focal neurological signs. He does appear to have some skin changes around his knuckles, I wonder whether this is dermatomyositis.  Discharge Instructions  Discharge Orders   Future Orders Complete By Expires     Diet - low sodium heart healthy  As directed     Increase activity slowly  As directed         Medication List    STOP taking these medications       lisinopril 20 MG tablet  Commonly known as:  PRINIVIL,ZESTRIL      TAKE these medications       acetaminophen 500 MG tablet  Commonly known as:  TYLENOL  Take 500 mg by mouth every 6 (six) hours as needed for pain.     amLODipine 5 MG tablet  Commonly known as:  NORVASC  Take 5 mg by mouth daily.     atenolol 25 MG tablet  Commonly known as:  TENORMIN  Take 25 mg by mouth 2 (two) times daily.     budesonide 3 MG 24 hr capsule  Commonly known as:  ENTOCORT EC  Take 3 capsules (9 mg total) by mouth daily.     cholestyramine 4 G packet  Commonly known as:  QUESTRAN  Take 1 packet by mouth daily.     cyanocobalamin 1000 MCG/ML injection  Commonly known as:  (VITAMIN B-12)  Inject 1 mL (1,000 mcg total) into the muscle every 30 (thirty) days.     cyclobenzaprine 10 MG tablet  Commonly known as:  FLEXERIL  Take 10 mg by mouth every 8 (eight) hours as needed. For muscle spasms     demeclocycline 150 MG tablet  Commonly known as:  DECLOMYCIN  Take 2 tablets (300 mg total) by mouth every 6 (six) hours.     esomeprazole 40 MG capsule  Commonly known as:  NEXIUM  Take 1 capsule (40 mg total) by mouth 2 (two) times daily before a meal.     ferrous sulfate 325 (65 FE) MG tablet   Take 325 mg by mouth 2 (two) times daily.     glipiZIDE 10 MG tablet  Commonly known as:  GLUCOTROL  Take 10 mg by mouth 2 (two) times daily before a meal.     HYDROcodone-acetaminophen 5-325 MG per tablet  Commonly known as:  NORCO/VICODIN  Take 1 tablet by mouth every 6 (six) hours as needed for pain.     levocetirizine 5 MG tablet  Commonly known as:  XYZAL  Take 5 mg by mouth every evening.     metFORMIN 1000 MG tablet  Commonly known as:  GLUCOPHAGE  Take 1,000 mg by mouth 2 (two) times daily with a meal.     multivitamin per tablet  Take 1 tablet by mouth daily.     sildenafil 100 MG tablet  Commonly known as:  VIAGRA  Take 100 mg by mouth daily as needed. For erectile dysfunction     testosterone cypionate 200 MG/ML  injection  Commonly known as:  DEPOTESTOTERONE CYPIONATE  Inject 200 mg into the muscle every 14 (fourteen) days.       Allergies  Allergen Reactions  . Codeine Hives and Itching  . Mesalamine Nausea And Vomiting  . Oxycodone Hcl Hives and Itching       Follow-up Information   Follow up with [facility]. (Patient is to be seen in the multi-disciplinary  thoracic oncology clinic)    Contact information:   [address] [place] [id number] [place] [address] [phone]       The results of significant diagnostics from this hospitalization (including imaging, microbiology, ancillary and laboratory) are listed below for reference.    Significant Diagnostic Studies: Ct Chest W Contrast  01/05/2013   *RADIOLOGY REPORT*  Clinical Data:  Chronic hyponatremia.  Evaluate for malignancy.  CT CHEST, ABDOMEN AND PELVIS WITH CONTRAST  Technique:  Multidetector CT imaging of the chest, abdomen and pelvis was performed following the standard protocol during bolus administration of intravenous contrast.  Contrast: OMNIPAQUE IOHEXOL 300 MG/ML  SOLN  Comparison:  CT abdomen dated 04/18/2007.  CT CHEST  Findings:   Visualized thyroid is unremarkable.  There is no axillary, mediastinal or hilar lymphadenopathy.  There is a 0.6 cm prevascular lymph node and a 0.8 cm right paratracheal lymph node. Normal heart size.  Coronary artery calcifications.  No pericardial effusion.  The central airways are patent.  Minimal scarring and/or atelectasis within the right lower lobe. Postsurgical change compatible with right lower lobe wedge resection.  There are multiple left lung pulmonary nodules.  Reference nodule in the left upper lobe measures 6 mm (image 13); left upper lobe measuring 7 mm (image 25); left lower lobe measuring 1.0 cm (image 25).  No pleural effusion or pneumothorax.  No aggressive osseous lesions.  Healed old bilateral rib fractures.  IMPRESSION: 1. Multiple nodules within the left lung, the largest of which measures up to 1.0 cm. While these may be secondary to a post infectious/inflammatory process, given the remote history of renal malignancy, metastatic disease is a consideration.  CT ABDOMEN AND PELVIS  Findings:  Evaluation of the abdomen is limited secondary to motion artifact.  Liver is normal in size and contour without focal hepatic lesion identified.  Portal vein is patent.  Status post cholecystectomy.  Spleen is grossly unremarkable.  Stable rim calcified mass along the superior aspect of the left kidney measuring up to 6.5 cm, most compatible with postoperative change. Additional small amount of soft tissue along the pancreatic tail measuring up to 2.0 cm is stable dating back to 2008 and compatible with benign etiology.  The pancreas and bilateral adrenal glands are unremarkable.  The kidneys enhance symmetrically with contrast. Unchanged 2.2 cm soft tissue mass anterior to the interpolar region of the left kidney (image 75; series 3) dating back to 2008, likely benign postoperative change.  Normal caliber abdominal aorta.  No retroperitoneal lymphadenopathy.  Urinary bladder is grossly unremarkable.  Prostate is unremarkable.  Oral contrast material is demonstrated throughout the bowel.  No evidence for bowel obstruction.  No abnormal bowel wall thickening. No free fluid or free intraperitoneal air.  Colonic gas and stranding within the sub the fat of the anterior abdominal wall, likely sequelae of prior percutaneous injection.  No aggressive appearing osseous lesion.  Lower lumbar spine degenerative change.  IMPRESSION:  Unchanged peripherally calcified soft tissue mass adjacent to the superior aspect of the left kidney and a second mass  along the anterior margin of the left kidney, these are stable dating back to 2008 and likely sequela of prior surgery.   Original Report Authenticated By: Annia Belt, M.D   Ct Abdomen Pelvis W Contrast  01/05/2013   *RADIOLOGY REPORT*  Clinical Data:  Chronic hyponatremia.  Evaluate for malignancy.  CT CHEST, ABDOMEN AND PELVIS WITH CONTRAST  Technique:  Multidetector CT imaging of the chest, abdomen and pelvis was performed following the standard protocol during bolus administration of intravenous contrast.  Contrast: OMNIPAQUE IOHEXOL 300 MG/ML  SOLN  Comparison:  CT abdomen dated 04/18/2007.  CT CHEST  Findings:  Visualized thyroid is unremarkable.  There is no axillary, mediastinal or hilar lymphadenopathy.  There is a 0.6 cm prevascular lymph node and a 0.8 cm right paratracheal lymph node. Normal heart size.  Coronary artery calcifications.  No pericardial effusion.  The central airways are patent.  Minimal scarring and/or atelectasis within the right lower lobe. Postsurgical change compatible with right lower lobe wedge resection.  There are multiple left lung pulmonary nodules.  Reference nodule in the left upper lobe measures 6 mm (image 13); left upper lobe measuring 7 mm (image 25); left lower lobe measuring 1.0 cm (image 25).  No pleural effusion or pneumothorax.  No aggressive osseous lesions.  Healed old bilateral rib fractures.  IMPRESSION: 1. Multiple  nodules within the left lung, the largest of which measures up to 1.0 cm. While these may be secondary to a post infectious/inflammatory process, given the remote history of renal malignancy, metastatic disease is a consideration.  CT ABDOMEN AND PELVIS  Findings:  Evaluation of the abdomen is limited secondary to motion artifact.  Liver is normal in size and contour without focal hepatic lesion identified.  Portal vein is patent.  Status post cholecystectomy.  Spleen is grossly unremarkable.  Stable rim calcified mass along the superior aspect of the left kidney measuring up to 6.5 cm, most compatible with postoperative change. Additional small amount of soft tissue along the pancreatic tail measuring up to 2.0 cm is stable dating back to 2008 and compatible with benign etiology.  The pancreas and bilateral adrenal glands are unremarkable.  The kidneys enhance symmetrically with contrast. Unchanged 2.2 cm soft tissue mass anterior to the interpolar region of the left kidney (image 75; series 3) dating back to 2008, likely benign postoperative change.  Normal caliber abdominal aorta.  No retroperitoneal lymphadenopathy.  Urinary bladder is grossly unremarkable. Prostate is unremarkable.  Oral contrast material is demonstrated throughout the bowel.  No evidence for bowel obstruction.  No abnormal bowel wall thickening. No free fluid or free intraperitoneal air.  Colonic gas and stranding within the sub the fat of the anterior abdominal wall, likely sequelae of prior percutaneous injection.  No aggressive appearing osseous lesion.  Lower lumbar spine degenerative change.  IMPRESSION:  Unchanged peripherally calcified soft tissue mass adjacent to the superior aspect of the left kidney and a second mass along the anterior margin of the left kidney, these are stable dating back to 2008 and likely sequela of prior surgery.   Original Report Authenticated By: Annia Belt, M.D   Dg Chest Port 1 View  01/04/2013    *RADIOLOGY REPORT*  Clinical Data: Shortness of breath  PORTABLE CHEST - 1 VIEW  Comparison: Chest x-ray of 03/15/2011  Findings: No active infiltrate or effusion is noted.  The heart is borderline enlarged.  No bony abnormality is seen.  IMPRESSION: Stable chest x-ray with borderline cardiomegaly.  No active  lung disease.   Original Report Authenticated By: Dwyane Dee, M.D.   Dg Hand Complete Right  01/06/2013   *RADIOLOGY REPORT*  Clinical Data: Hand swelling, redness, and pain.  RIGHT HAND - COMPLETE 3+ VIEW  Comparison: None.  Findings: Three-view study shows no fracture.  No subluxation or dislocation.  No worrisome lytic or sclerotic osseous abnormality.  IMPRESSION: No acute bony findings.   Original Report Authenticated By: Kennith Center, M.D.    Microbiology: Recent Results (from the past 240 hour(s))  CULTURE, BLOOD (ROUTINE X 2)     Status: None   Collection Time    01/04/13  9:12 AM      Result Value Range Status   Specimen Description BLOOD RIGHT ANTECUBITAL DRAWN BY RN CF   Final   Special Requests BOTTLES DRAWN AEROBIC ONLY 6CC   Final   Culture NO GROWTH 4 DAYS   Final   Report Status PENDING   Incomplete  CULTURE, BLOOD (ROUTINE X 2)     Status: None   Collection Time    01/04/13  9:38 AM      Result Value Range Status   Specimen Description BLOOD LEFT ANTECUBITAL   Final   Special Requests     Final   Value: BOTTLES DRAWN AEROBIC AND ANAEROBIC AEB=15CC ANA=10CC   Culture NO GROWTH 4 DAYS   Final   Report Status PENDING   Incomplete  MRSA PCR SCREENING     Status: None   Collection Time    01/04/13 11:23 AM      Result Value Range Status   MRSA by PCR NEGATIVE  NEGATIVE Final   Comment:            The GeneXpert MRSA Assay (FDA     approved for NASAL specimens     only), is one component of a     comprehensive MRSA colonization     surveillance program. It is not     intended to diagnose MRSA     infection nor to guide or     monitor treatment for     MRSA  infections.     Labs: Basic Metabolic Panel:  Recent Labs Lab 01/04/13 0912 01/04/13 2014 01/05/13 0457 01/06/13 0514 01/07/13 0622  NA 120* 117* 118* 116* 118*  K 3.2* 3.4* 3.5 3.6 3.4*  CL 77* 81* 86* 82* 82*  CO2 25 20 19  18* 19  GLUCOSE 203* 238* 191* 212* 206*  BUN 12 10 9 10 11   CREATININE 0.93 0.79 0.71 0.70 0.73  CALCIUM 9.5 8.9 8.9 9.0 9.0   Liver Function Tests:  Recent Labs Lab 01/04/13 0912 01/05/13 0457  AST 15 14  ALT 19 16  ALKPHOS 44 40  BILITOT 1.3* 1.0  PROT 8.3 7.2  ALBUMIN 3.8 3.1*     CBC:  Recent Labs Lab 01/04/13 0912 01/05/13 0457 01/06/13 0514  WBC 14.0* 13.5* 13.5*  NEUTROABS 11.0*  --   --   HGB 14.0 12.8* 12.6*  HCT 38.5* 34.9* 33.9*  MCV 82.1 81.4 80.9  PLT 265 271 261   Cardiac Enzymes:  Recent Labs Lab 01/04/13 0912 01/04/13 1559  CKTOTAL 124  --   CKMB 2.3  --   TROPONINI <0.30 <0.30   BNP: BNP (last 3 results)  Recent Labs  01/04/13 0912  PROBNP 108.2   CBG:  Recent Labs Lab 01/06/13 2042 01/07/13 0749 01/07/13 1116 01/07/13 1611 01/08/13 0709  GLUCAP 206* 180* 247* 220* 194*  SignedWilson Singer  [facility] 01/08/2013, 11:08 AM

## 2013-01-08 NOTE — Progress Notes (Signed)
Patient ID: [name], male   DOB: 1955-09-02, 57 y.o.   MRN: [id number] Micah Flesher to see patient in his home. This morning patient seems to be very angry and he want to go home. He asked me whether his wife is the one who signed the paper for him to stay in the hospital. Patient states that he knows she has always low sodium and he feels good and he does want to have any blood work done. Patient states that the only thing he wants his to go home. I tried to explain to him about the effect of hyponatremia including weakness, falling down and confusion including seizure. Patient presently says that he was feeling good and he doesn't want any blood work and will like to sign himself out of the hospital. After her discussion as the end and his wife came her to start discussing. Presently and not able to examine the patient. Plan: We'll DC his IV fluids contains potassium I encouraged him to have blood work to be done to see where his sodium is and if it is good he  may be  for him to go home if there is no other issues. Patient however declined.

## 2013-01-09 LAB — CULTURE, BLOOD (ROUTINE X 2)

## 2013-01-09 NOTE — Progress Notes (Signed)
UR chart review completed.  

## 2013-01-10 LAB — GLUCOSE, CAPILLARY: Glucose-Capillary: 193 mg/dL — ABNORMAL HIGH (ref 70–99)

## 2013-02-24 ENCOUNTER — Encounter: Payer: Self-pay | Admitting: Internal Medicine

## 2013-03-31 ENCOUNTER — Encounter: Payer: Self-pay | Admitting: Internal Medicine

## 2013-03-31 ENCOUNTER — Ambulatory Visit (INDEPENDENT_AMBULATORY_CARE_PROVIDER_SITE_OTHER): Payer: Medicare Other | Admitting: Internal Medicine

## 2013-03-31 ENCOUNTER — Encounter (INDEPENDENT_AMBULATORY_CARE_PROVIDER_SITE_OTHER): Payer: Self-pay

## 2013-03-31 VITALS — BP 153/88 | HR 74 | Temp 98.4°F | Wt 190.4 lb

## 2013-03-31 DIAGNOSIS — K5090 Crohn's disease, unspecified, without complications: Secondary | ICD-10-CM

## 2013-03-31 DIAGNOSIS — K219 Gastro-esophageal reflux disease without esophagitis: Secondary | ICD-10-CM

## 2013-03-31 DIAGNOSIS — K5080 Crohn's disease of both small and large intestine without complications: Secondary | ICD-10-CM

## 2013-03-31 MED ORDER — BUDESONIDE 3 MG PO CP24: 3.0000 mg | ORAL_CAPSULE | Freq: Three times a day (TID) | ORAL | Status: DC

## 2013-03-31 MED ORDER — OMEPRAZOLE 40 MG PO CPDR: 40.0000 mg | DELAYED_RELEASE_CAPSULE | Freq: Two times a day (BID) | ORAL | Status: DC

## 2013-03-31 NOTE — Patient Instructions (Signed)
Begin Omeprazole 40 mg twice daily  Continue Entocort 9 mg daily  Office visit in 6 months
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
## 2013-07-06 ENCOUNTER — Telehealth: Payer: Self-pay | Admitting: Internal Medicine

## 2013-07-06 NOTE — Telephone Encounter (Signed)
Pt's wife called late afternoon asking if patient is late with payments and [facility] isn't working with them on their payment arrangements would it be a problem to see RMR. I told her no that we don't turn our patients away if they can't make payment at time of OV. Then she asked if RMR ordered any tests would it cause a problem with the hospital. I told her that Im not familiar with the billing office's policy of payment arrangement, but if they are paying something that it shouldn't be a problem. I told her that I would ask CM in the morning and find out what we could do to help. Please call [phone].

## 2013-07-11 NOTE — Telephone Encounter (Signed)
I spoke with [name] and gave her the number to the billing office at [facility].  She also wanted to know that if she had a HB that is in collections could they refuse services.  I told her no.

## 2013-08-09 IMAGING — CT CT CHEST W/ CM
2 of 4 series · 13 of 36 positions shown, 16 images · IV contrast (Omnipaque 300)
Comparison: CT abdomen dated 04/18/2007.

CT CHEST

CLINICAL DATA: Chronic hyponatremia.  Evaluate for malignancy.

CT CHEST, ABDOMEN AND PELVIS WITH CONTRAST
TECHNIQUE: Multidetector CT imaging of the chest, abdomen and
pelvis was performed following the standard protocol during bolus
administration of intravenous contrast.
Contrast: 100mL OMNIPAQUE IOHEXOL 300 MG/ML  SOLN

[Series 3: cap with 5.0 b40f · axial · 0.76mm/px · z∈[-638,-52]mm · 10 of 131 slices shown, 13 images]
[im 7/131  mediastinal]
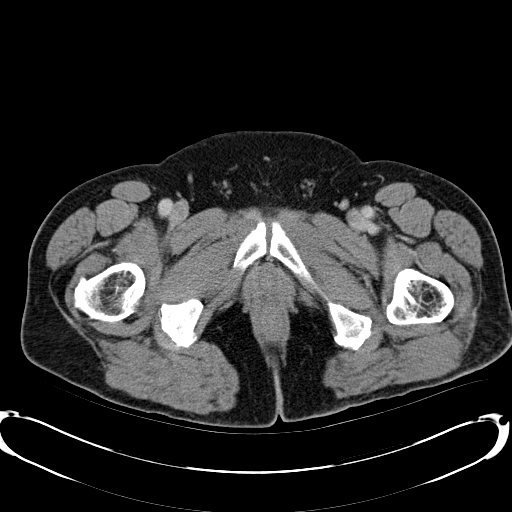
[im 7/131  lung]
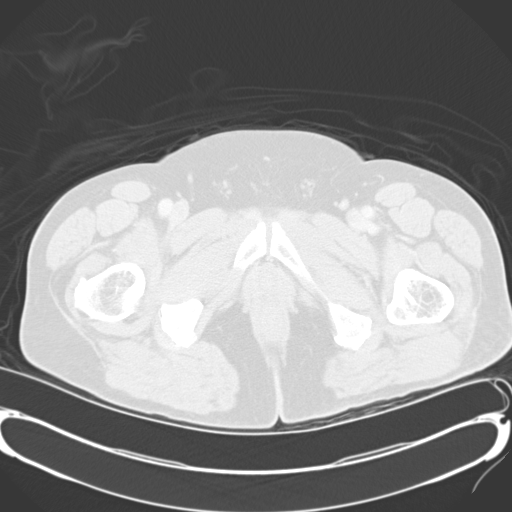
[im 20/131  lung]
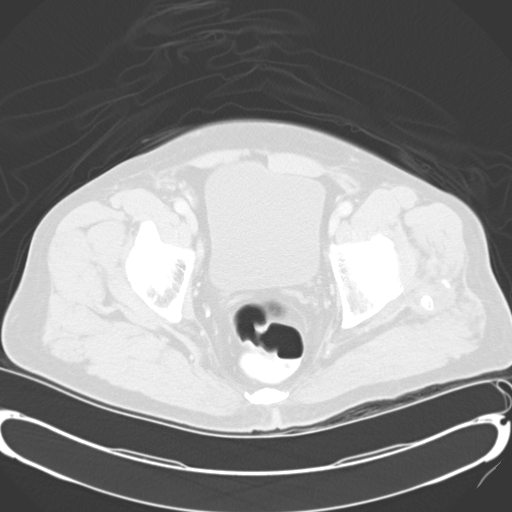
[im 33/131  lung]
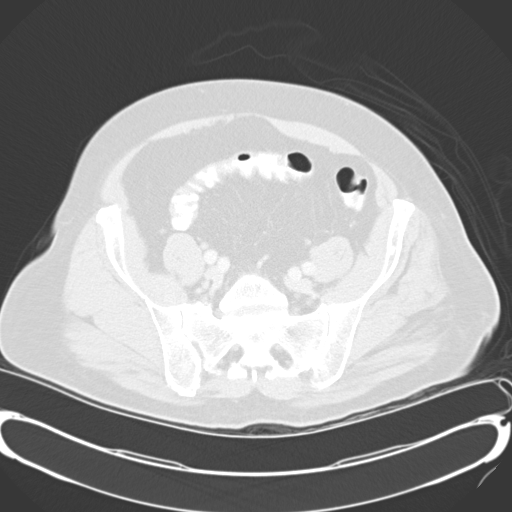
[im 46/131  lung]
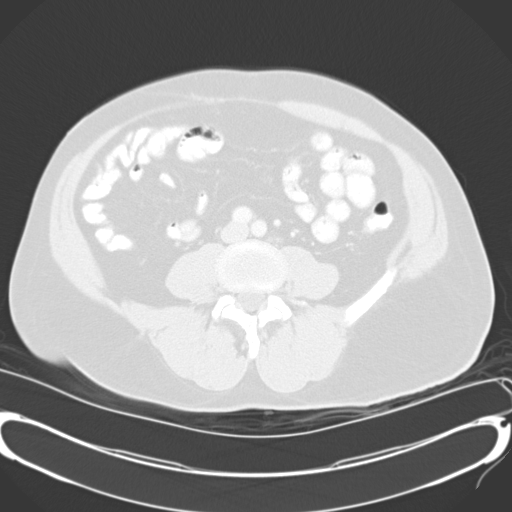
[im 59/131  mediastinal]
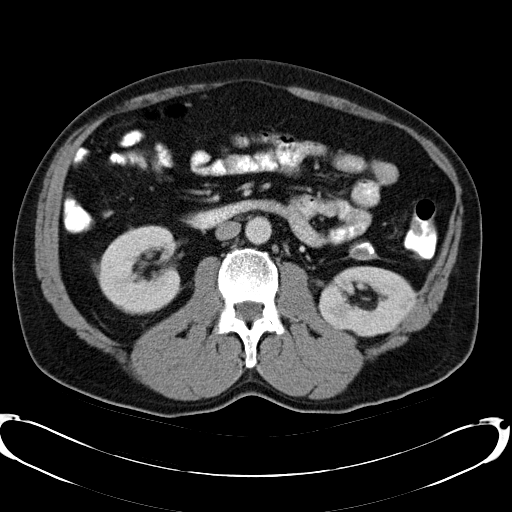
[im 59/131  lung]
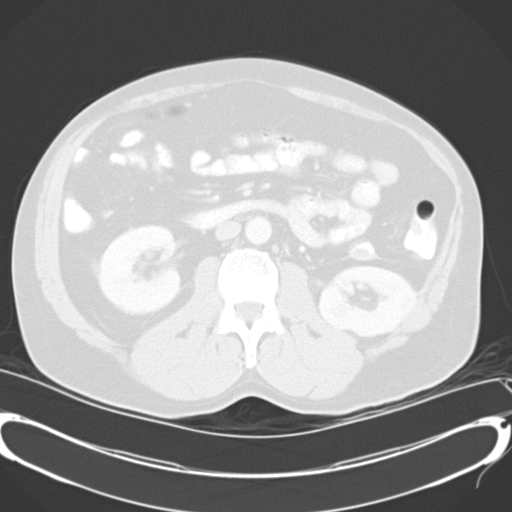
[im 72/131  lung]
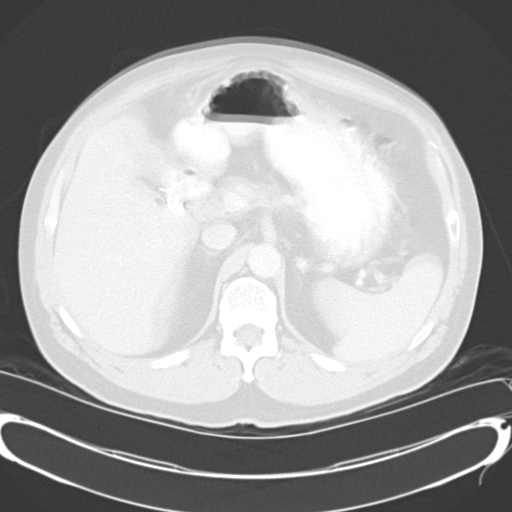
[im 85/131  lung]
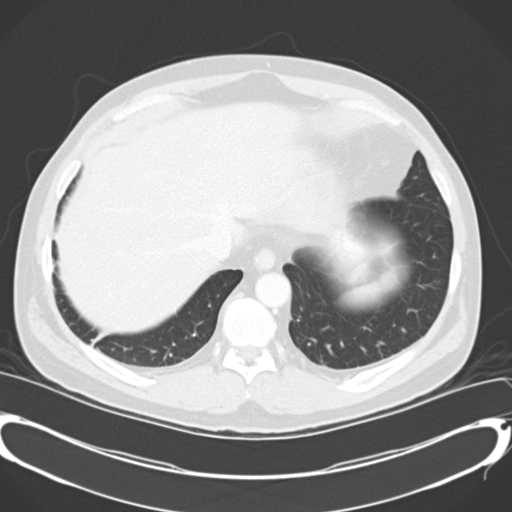
[im 98/131  lung]
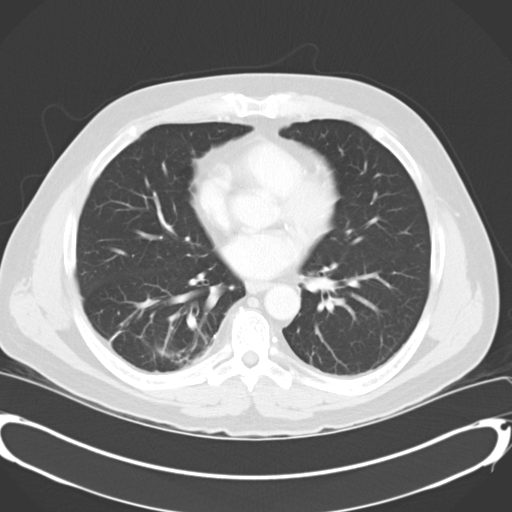
[im 111/131  mediastinal]
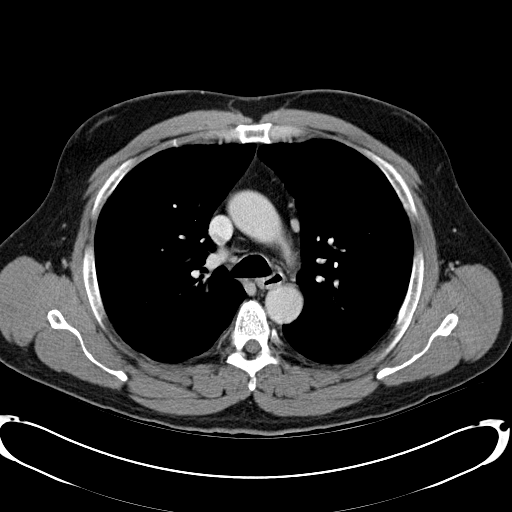
[im 111/131  lung]
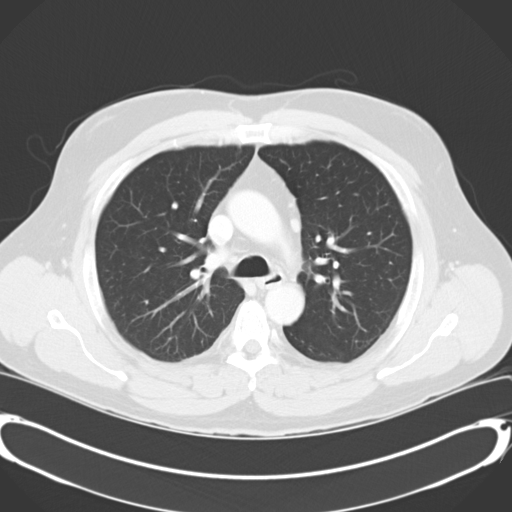
[im 124/131  lung]
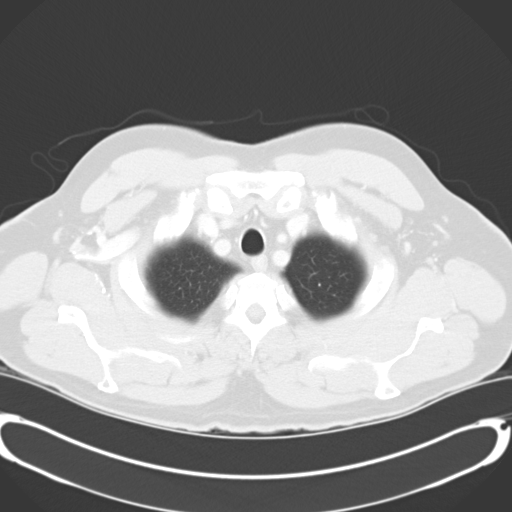

[Series 5: mpr cor post contrast (id) · coronal · 0.82mm/px · 3 of 90 slices shown]
[im 18/90  lung]
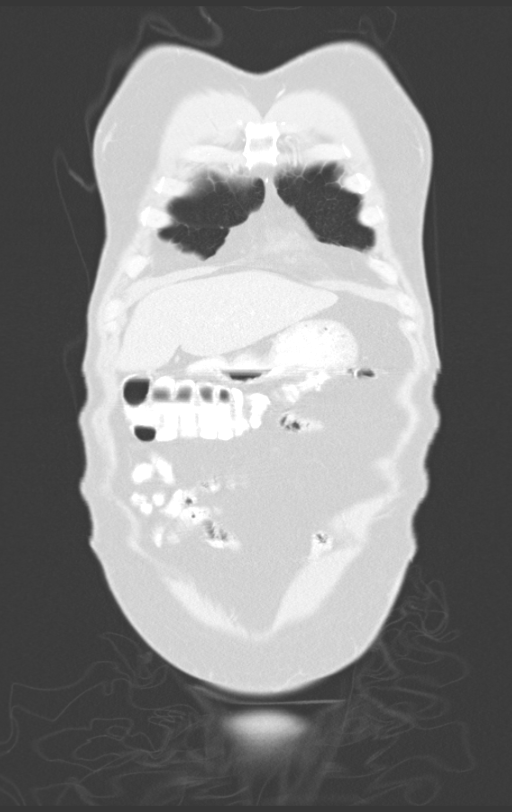
[im 36/90  lung]
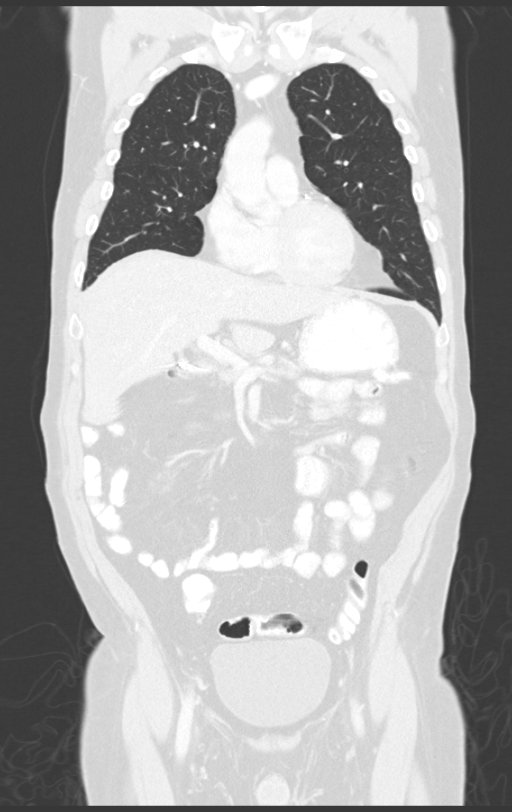
[im 54/90  lung]
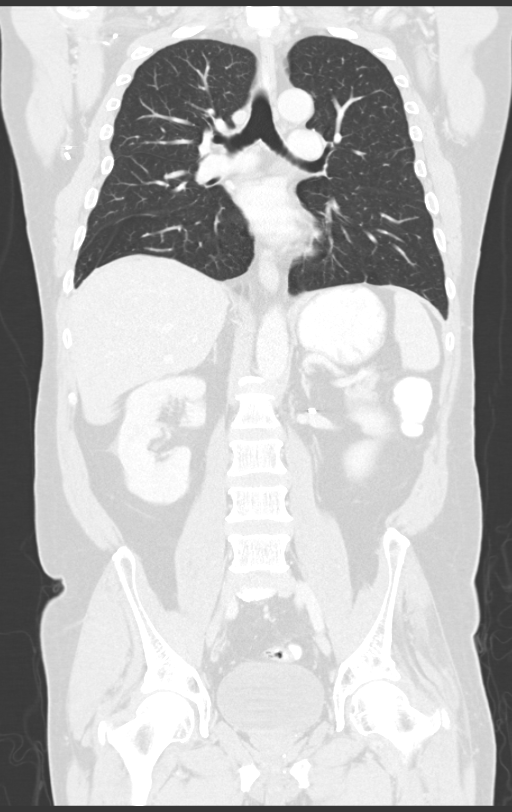

[13 of 36 positions shown; findings below may reference images not displayed]

FINDINGS: Visualized thyroid is unremarkable.  There is no
axillary, mediastinal or hilar lymphadenopathy.  There is a 0.6 cm
prevascular lymph node and a 0.8 cm right paratracheal lymph node.
Normal heart size.  Coronary artery calcifications.  No pericardial
effusion.

The central airways are patent.  Minimal scarring and/or
atelectasis within the right lower lobe. Postsurgical change
compatible with right lower lobe wedge resection.

There are multiple left lung pulmonary nodules.  Reference nodule
in the left upper lobe measures 6 mm (image 13); left upper lobe
measuring 7 mm (image 25); left lower lobe measuring 1.0 cm (image
25).  No pleural effusion or pneumothorax.

No aggressive osseous lesions.  Healed old bilateral rib fractures.
IMPRESSION: 1. Multiple nodules within the left lung, the largest of which
measures up to 1.0 cm. While these may be secondary to a post
infectious/inflammatory process, given the remote history of renal
malignancy, metastatic disease is a consideration.

CT ABDOMEN AND PELVIS
FINDINGS: Evaluation of the abdomen is limited secondary to motion
artifact.  Liver is normal in size and contour without focal
hepatic lesion identified.  Portal vein is patent.  Status post
cholecystectomy.  Spleen is grossly unremarkable.  Stable rim
calcified mass along the superior aspect of the left kidney
measuring up to 6.5 cm, most compatible with postoperative change.
Additional small amount of soft tissue along the pancreatic tail
measuring up to 2.0 cm is stable dating back to 9552 and compatible
with benign etiology.  The pancreas and bilateral adrenal glands
are unremarkable.  The kidneys enhance symmetrically with contrast.
Unchanged 2.2 cm soft tissue mass anterior to the interpolar region
of the left kidney (image 75; series 3) dating back to 9552, likely
benign postoperative change.

Normal caliber abdominal aorta.  No retroperitoneal
lymphadenopathy.  Urinary bladder is grossly unremarkable.
Prostate is unremarkable.

Oral contrast material is demonstrated throughout the bowel.  No
evidence for bowel obstruction.  No abnormal bowel wall thickening.
No free fluid or free intraperitoneal air.  Colonic gas and
stranding within the sub the fat of the anterior abdominal wall,
likely sequelae of prior percutaneous injection.

No aggressive appearing osseous lesion.  Lower lumbar spine
degenerative change.
IMPRESSION: Unchanged peripherally calcified soft tissue mass adjacent to the
superior aspect of the left kidney and a second mass along the
anterior margin of the left kidney, these are stable dating back to
9552 and likely sequela of prior surgery.

## 2013-10-06 ENCOUNTER — Encounter (INDEPENDENT_AMBULATORY_CARE_PROVIDER_SITE_OTHER): Payer: Self-pay

## 2013-10-06 ENCOUNTER — Encounter: Payer: Self-pay | Admitting: Internal Medicine

## 2013-10-06 ENCOUNTER — Ambulatory Visit (INDEPENDENT_AMBULATORY_CARE_PROVIDER_SITE_OTHER): Payer: Medicare Other | Admitting: Internal Medicine

## 2013-10-06 VITALS — BP 134/84 | HR 81 | Temp 98.0°F | Ht 67.0 in | Wt 188.2 lb

## 2013-10-06 DIAGNOSIS — K219 Gastro-esophageal reflux disease without esophagitis: Secondary | ICD-10-CM

## 2013-10-06 DIAGNOSIS — K5090 Crohn's disease, unspecified, without complications: Secondary | ICD-10-CM
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
## 2013-10-06 NOTE — Patient Instructions (Addendum)
Continue Entocort 9 mg daily (as discussed, the benefits outweigh the risks)  Continue omeprazole twice daily  See Dr.Milam regarding treatment for potential lower extremity neuropathy  Office visit in 6 months

## 2013-10-30 ENCOUNTER — Other Ambulatory Visit: Payer: Self-pay

## 2013-10-31 MED ORDER — CYANOCOBALAMIN 1000 MCG/ML IJ SOLN: 1000.0000 ug | INTRAMUSCULAR | Status: DC

## 2013-11-21 ENCOUNTER — Other Ambulatory Visit: Payer: Self-pay | Admitting: *Deleted

## 2013-11-22 MED ORDER — CHOLESTYRAMINE 4 G PO PACK: 1.0000 | PACK | Freq: Every day | ORAL | Status: AC

## 2014-03-02 ENCOUNTER — Other Ambulatory Visit: Payer: Self-pay

## 2014-03-05 MED ORDER — BUDESONIDE 3 MG PO CP24: 9.0000 mg | ORAL_CAPSULE | Freq: Every day | ORAL | Status: DC

## 2014-03-05 MED ORDER — OMEPRAZOLE 40 MG PO CPDR: 40.0000 mg | DELAYED_RELEASE_CAPSULE | Freq: Two times a day (BID) | ORAL | Status: DC

## 2014-03-12 ENCOUNTER — Encounter: Payer: Self-pay | Admitting: Internal Medicine

## 2014-05-03 ENCOUNTER — Encounter: Payer: Self-pay | Admitting: *Deleted

## 2014-05-03 NOTE — Telephone Encounter (Signed)
error 

## 2014-05-04 ENCOUNTER — Ambulatory Visit (INDEPENDENT_AMBULATORY_CARE_PROVIDER_SITE_OTHER): Payer: Medicare Other | Admitting: Internal Medicine

## 2014-05-04 ENCOUNTER — Encounter: Payer: Self-pay | Admitting: Internal Medicine

## 2014-05-04 VITALS — BP 132/81 | HR 88 | Temp 97.7°F | Ht 68.0 in | Wt 180.2 lb

## 2014-05-04 DIAGNOSIS — K50119 Crohn's disease of large intestine with unspecified complications: Secondary | ICD-10-CM
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
## 2014-05-23 ENCOUNTER — Other Ambulatory Visit: Payer: Self-pay

## 2014-06-11 ENCOUNTER — Encounter: Payer: Self-pay | Admitting: Internal Medicine

## 2014-06-12 ENCOUNTER — Encounter: Payer: Self-pay | Admitting: Internal Medicine

## 2014-08-02 ENCOUNTER — Encounter: Payer: Self-pay | Admitting: Internal Medicine

## 2017-01-29 ENCOUNTER — Encounter: Payer: Self-pay | Admitting: "Endocrinology

## 2017-09-28 LAB — HEMOGLOBIN A1C: Hemoglobin A1C: 7.9

## 2017-11-15 ENCOUNTER — Ambulatory Visit (INDEPENDENT_AMBULATORY_CARE_PROVIDER_SITE_OTHER): Payer: Medicare Other | Admitting: "Endocrinology

## 2017-11-15 ENCOUNTER — Encounter: Payer: Self-pay | Admitting: "Endocrinology

## 2017-11-15 VITALS — BP 127/80 | HR 69 | Ht 70.0 in | Wt 202.0 lb

## 2017-11-15 DIAGNOSIS — E291 Testicular hypofunction: Secondary | ICD-10-CM

## 2017-11-15 DIAGNOSIS — I10 Essential (primary) hypertension: Secondary | ICD-10-CM | POA: Diagnosis not present

## 2017-11-15 DIAGNOSIS — E782 Mixed hyperlipidemia: Secondary | ICD-10-CM

## 2017-11-15 DIAGNOSIS — E1165 Type 2 diabetes mellitus with hyperglycemia: Secondary | ICD-10-CM | POA: Diagnosis not present

## 2017-11-15 MED ORDER — METFORMIN HCL 1000 MG PO TABS: 1000.0000 mg | ORAL_TABLET | Freq: Two times a day (BID) | ORAL | 1 refills | Status: DC

## 2017-11-15 MED ORDER — "INSULIN SYRINGE 31G X 5/16"" 0.3 ML MISC": 1 refills | Status: DC

## 2017-11-15 MED ORDER — "SYRINGE/NEEDLE (DISP) 21G X 1-1/2"" 3 ML MISC": 0 refills | Status: DC

## 2017-11-15 MED ORDER — TESTOSTERONE CYPIONATE 200 MG/ML IJ SOLN: 200.0000 mg | INTRAMUSCULAR | 1 refills | Status: DC

## 2017-11-15 MED ORDER — GLUCOSE BLOOD VI STRP: ORAL_STRIP | 1 refills | Status: DC
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
## 2017-11-15 NOTE — Patient Instructions (Signed)

## 2017-11-16 ENCOUNTER — Other Ambulatory Visit: Payer: Self-pay

## 2017-11-16 ENCOUNTER — Encounter: Payer: Self-pay | Admitting: "Endocrinology

## 2017-11-16 MED ORDER — "INSULIN SYRINGE 31G X 5/16"" 0.3 ML MISC": 1 refills | Status: AC

## 2017-11-16 MED ORDER — TESTOSTERONE CYPIONATE 200 MG/ML IJ SOLN: 200.0000 mg | INTRAMUSCULAR | 0 refills | Status: AC

## 2017-11-16 MED ORDER — "SYRINGE/NEEDLE (DISP) 21G X 1-1/2"" 3 ML MISC": 0 refills | Status: AC

## 2017-11-16 MED ORDER — METFORMIN HCL 1000 MG PO TABS: 1000.0000 mg | ORAL_TABLET | Freq: Two times a day (BID) | ORAL | 0 refills | Status: AC

## 2017-11-16 MED ORDER — GLUCOSE BLOOD VI STRP: ORAL_STRIP | 1 refills | Status: AC

## 2017-11-18 ENCOUNTER — Other Ambulatory Visit: Payer: Self-pay

## 2017-11-18 MED ORDER — INSULIN SYRINGE 31G X 5/16" 0.5 ML MISC
1.0000 | Freq: Two times a day (BID) | 1 refills | Status: AC
Start: 2017-11-18 — End: ?

## 2018-01-03 ENCOUNTER — Ambulatory Visit: Payer: Medicare Other | Admitting: "Endocrinology
